# Patient Record
Sex: Male | Born: 1980 | Race: Black or African American | Hispanic: No | Marital: Married | Smoking: Never smoker
Health system: Southern US, Community
[De-identification: ages and names within clinical notes are randomized; demographics above are authoritative.]

---
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
## 2005-06-02 ENCOUNTER — Emergency Department (HOSPITAL_COMMUNITY): Admission: EM | Admit: 2005-06-02 | Discharge: 2005-06-03 | Payer: Self-pay | Admitting: Emergency Medicine

## 2021-05-17 ENCOUNTER — Encounter (HOSPITAL_COMMUNITY): Payer: Self-pay

## 2021-05-17 ENCOUNTER — Other Ambulatory Visit: Payer: Self-pay

## 2021-05-17 ENCOUNTER — Ambulatory Visit (HOSPITAL_COMMUNITY)
Admission: EM | Admit: 2021-05-17 | Discharge: 2021-05-17 | Disposition: A | Discharge status: Home / Self Care | Payer: Medicaid Other | Attending: Emergency Medicine | Admitting: Emergency Medicine

## 2021-05-17 DIAGNOSIS — R1084 Generalized abdominal pain: Secondary | ICD-10-CM

## 2021-05-17 DIAGNOSIS — E860 Dehydration: Secondary | ICD-10-CM | POA: Diagnosis not present

## 2021-05-17 DIAGNOSIS — R000 Tachycardia, unspecified: Secondary | ICD-10-CM | POA: Diagnosis not present

## 2021-05-17 LAB — POCT URINALYSIS DIPSTICK, ED / UC
Bilirubin Urine: NEGATIVE
Glucose, UA: NEGATIVE mg/dL
Hgb urine dipstick: NEGATIVE
Ketones, ur: 40 mg/dL — AB
Leukocytes,Ua: NEGATIVE
Nitrite: NEGATIVE
Protein, ur: NEGATIVE mg/dL
Specific Gravity, Urine: <=1.005 (ref 1.005–1.030)
Urobilinogen, UA: 0.2 mg/dL (ref 0.0–1.0)
pH: 6 (ref 5.0–8.0)

## 2021-05-17 NOTE — ED Triage Notes (Addendum)
Pt c/o pinching in sides when standing up.  Pt adds that he recently was prescribed antibiotics for a dental issue but never picked them up. Reports dental pain is improving.

## 2021-05-17 NOTE — ED Notes (Signed)
Pt notified of need for urine sample, unable to provide at this time. Water provided.

## 2021-05-17 NOTE — Discharge Instructions (Addendum)
Push fluids.Follow up with Er if pain persists. UA was negative for infection.

## 2021-05-17 NOTE — ED Provider Notes (Signed)
[facility]    CSN: [id number] Arrival date & time: 05/17/21  1853      History   Chief Complaint Chief Complaint  Patient presents with   side pain     HPI Edwin Nicholson is a 40 y.o. male.   40 year old special needs male presents to urgent care chief complaint of bilateral side pinching when he stands.  Patient has history of UTIs, denies nausea vomiting diarrhea, denies injury or trauma.  Last BM was today(normal).  Secondarily patient had antibiotic for dental infection but never picked up reports that is improving.  The history is provided by the patient and a relative. No language interpreter was used.   History reviewed. No pertinent past medical history.  Patient Active Problem List   Diagnosis Date Noted   Generalized abdominal pain 05/17/2021     History reviewed. No pertinent surgical history.     Home Medications    Prior to Admission medications   Not on File    Family History History reviewed. No pertinent family history.  Social History Social History   Tobacco Use   Smoking status: Never   Smokeless tobacco: Never  Substance Use Topics   Alcohol use: Not Currently   Drug use: Not Currently     Allergies   Patient has no known allergies.   Review of Systems Review of Systems  Constitutional:  Negative for activity change, appetite change and fever.  Gastrointestinal:  Positive for abdominal pain. Negative for constipation, nausea and vomiting.  All other systems reviewed and are negative.   Physical Exam Triage Vital Signs ED Triage Vitals  Enc Vitals Group     BP 05/17/21 2008 111/76     Pulse Rate 05/17/21 2008 (!) 101     Resp 05/17/21 2008 18     Temp 05/17/21 2008 99.3 F (37.4 C)     Temp Source 05/17/21 2008 Oral     SpO2 05/17/21 2008 97 %     Weight --      Height --      Head Circumference --      Peak Flow --      Pain Score 05/17/21 2009 0     Pain Loc --      Pain Edu? --      Excl. in GC?  --    No data found.  Updated Vital Signs BP 111/76   Pulse (!) 101   Temp 99.3 F (37.4 C) (Oral)   Resp 18   SpO2 97%   Visual Acuity Right Eye Distance:   Left Eye Distance:   Bilateral Distance:    Right Eye Near:   Left Eye Near:    Bilateral Near:     Physical Exam Vitals and nursing note reviewed.  Constitutional:      Appearance: Normal appearance. He is well-groomed and normal weight.     Comments: Pt stutters and is "special needs" per sister  HENT:     Head: Normocephalic.  Cardiovascular:     Rate and Rhythm: Tachycardia present.     Heart sounds: Normal heart sounds.  Pulmonary:     Effort: Pulmonary effort is normal.     Breath sounds: Normal breath sounds and air entry.  Abdominal:     General: Abdomen is flat. Bowel sounds are normal.     Tenderness: There is generalized abdominal tenderness.  Neurological:     Mental Status: He is alert.  Psychiatric:  Attention and Perception: Attention normal.        Behavior: Behavior is cooperative.     UC Treatments / Results  Labs (all labs ordered are listed, but only abnormal results are displayed) Labs Reviewed  POCT URINALYSIS DIPSTICK, ED / UC - Abnormal; Notable for the following components:      Result Value   Ketones, ur 40 (*)    All other components within normal limits    EKG   Radiology No results found.  Procedures Procedures (including critical care time)  Medications Ordered in UC Medications - No data to display  Initial Impression / Assessment and Plan / UC Course  I have reviewed the triage vital signs and the nursing notes.  Pertinent labs & imaging results that were available during my care of the patient were reviewed by me and considered in my medical decision making (see chart for details).    Abdominal pain, viral illness, UTI Final Clinical Impressions(s) / UC Diagnoses   Final diagnoses:  Mild dehydration  Generalized abdominal pain     Discharge  Instructions      Push fluids.Follow up with Er if pain persists. UA was negative for infection.      ED Prescriptions   None    PDMP not reviewed this encounter.   Clancy Gourd, NP 05/17/21 2110

## 2022-01-03 ENCOUNTER — Encounter (HOSPITAL_COMMUNITY): Payer: Self-pay

## 2022-01-03 ENCOUNTER — Ambulatory Visit (HOSPITAL_COMMUNITY)
Admission: EM | Admit: 2022-01-03 | Discharge: 2022-01-03 | Disposition: A | Discharge status: Home / Self Care | Payer: Medicaid Other | Attending: Family Medicine | Admitting: Family Medicine

## 2022-01-03 DIAGNOSIS — K219 Gastro-esophageal reflux disease without esophagitis: Secondary | ICD-10-CM

## 2022-01-03 MED ORDER — SUCRALFATE 1 G PO TABS: 1.0000 g | ORAL_TABLET | Freq: Three times a day (TID) | ORAL | 0 refills | Status: DC

## 2022-01-03 NOTE — ED Triage Notes (Signed)
Pt is here for chest pain x 8 month. She

## 2022-01-03 NOTE — Discharge Instructions (Signed)
You will need to follow up with your gastroenterologist since you're still having reflux symptoms while taking the acid-reducing medicines.

## 2022-01-03 NOTE — ED Triage Notes (Signed)
Pt has been treated for reflux in the past and reported the medication that he  was prescribed has not help his symptoms.

## 2022-01-08 NOTE — ED Provider Notes (Signed)
°  [facility]   [id number] 01/03/22 Arrival Time: 1636  ASSESSMENT & PLAN:  1. Gastroesophageal reflux disease without esophagitis    Benign abdominal exam. No indications for urgent abdominal/pelvic imaging at this time. Discussed. I feel the chest pain he is describing is either related to GERD or from his chest wall. No symptom of cardiac or resp etiology.   Continue PPI BID. Begin: Meds ordered this encounter  Medications   sucralfate (CARAFATE) 1 g tablet    Sig: Take 1 tablet (1 g total) by mouth 4 (four) times daily -  with meals and at bedtime.    Dispense:  28 tablet    Refill:  0     Discharge Instructions      You will need to follow up with your gastroenterologist since you're still having reflux symptoms while taking the acid-reducing medicines.    ECG declined. Reviewed expectations re: course of current medical issues. Questions answered. Outlined signs and symptoms indicating need for more acute intervention. Patient verbalized understanding. After Visit Summary given.   SUBJECTIVE: History from: patient. Edwin Nicholson is a 41 y.o. male who presents with complaint of epigastric and chest discomfort associated with acid reflux; long-standing. On PPI BID with occas breakthrough pain. Doesn't feel the medicine is helping as it should. Has seen GI. Normal PO intake without n/v/d. No back pain. No SOB. Chest pain is associated with increased belching; dull and anterior.  History reviewed. No pertinent surgical history.  Social History   Tobacco Use  Smoking Status Never  Smokeless Tobacco Never     OBJECTIVE:  BP 138/82 P 78 R 14 General appearance: alert, oriented, no acute distress HEENT: [place]; AT; oropharynx moist Lungs: unlabored respirations Abdomen: soft; without distention; no specific tenderness to palpation; normal bowel sounds; without masses or organomegaly; without guarding or rebound tenderness Back: without reported CVA  tenderness; FROM at waist Extremities: without LE edema; symmetrical; without gross deformities Skin: warm and dry Neurologic: normal gait Psychological: alert and cooperative; normal mood and affect   No Known Allergies                                             History reviewed. No pertinent past medical history.  Social History   Socioeconomic History   Marital status: Married    Spouse name: Not on file   Number of children: Not on file   Years of education: Not on file   Highest education level: Not on file  Occupational History   Not on file  Tobacco Use   Smoking status: Never   Smokeless tobacco: Never  Substance and Sexual Activity   Alcohol use: Not Currently   Drug use: Not Currently   Sexual activity: Not on file  Other Topics Concern   Not on file  Social History Narrative   Not on file   Social Determinants of Health   Financial Resource Strain: Not on file  Food Insecurity: Not on file  Transportation Needs: Not on file  Physical Activity: Not on file  Stress: Not on file  Social Connections: Not on file  Intimate Partner Violence: Not on file    History reviewed. No pertinent family history.   Mardella Layman, MD 01/08/22 [phone]

## 2022-05-08 ENCOUNTER — Emergency Department (HOSPITAL_COMMUNITY): Payer: Medicaid Other

## 2022-05-08 ENCOUNTER — Emergency Department (HOSPITAL_COMMUNITY)
Admission: EM | Admit: 2022-05-08 | Discharge: 2022-05-08 | Disposition: A | Discharge status: Home / Self Care | Payer: Medicaid Other | Attending: Emergency Medicine | Admitting: Emergency Medicine

## 2022-05-08 ENCOUNTER — Other Ambulatory Visit: Payer: Self-pay

## 2022-05-08 ENCOUNTER — Encounter (HOSPITAL_COMMUNITY): Payer: Self-pay

## 2022-05-08 DIAGNOSIS — R1010 Upper abdominal pain, unspecified: Secondary | ICD-10-CM

## 2022-05-08 DIAGNOSIS — R079 Chest pain, unspecified: Secondary | ICD-10-CM | POA: Diagnosis not present

## 2022-05-08 DIAGNOSIS — R1011 Right upper quadrant pain: Secondary | ICD-10-CM | POA: Insufficient documentation

## 2022-05-08 LAB — CBC
HCT: 39.7 % (ref 39.0–52.0)
Hemoglobin: 13 g/dL (ref 13.0–17.0)
MCH: 29.5 pg (ref 26.0–34.0)
MCHC: 32.7 g/dL (ref 30.0–36.0)
MCV: 90 fL (ref 80.0–100.0)
Platelets: 184 10*3/uL (ref 150–400)
RBC: 4.41 MIL/uL (ref 4.22–5.81)
RDW: 11.9 % (ref 11.5–15.5)
WBC: 5.4 10*3/uL (ref 4.0–10.5)
nRBC: 0 % (ref 0.0–0.2)

## 2022-05-08 LAB — URINALYSIS, ROUTINE W REFLEX MICROSCOPIC
Bilirubin Urine: NEGATIVE
Glucose, UA: NEGATIVE mg/dL
Hgb urine dipstick: NEGATIVE
Ketones, ur: 20 mg/dL — AB
Leukocytes,Ua: NEGATIVE
Nitrite: NEGATIVE
Protein, ur: NEGATIVE mg/dL
Specific Gravity, Urine: 1.013 (ref 1.005–1.030)
pH: 6 (ref 5.0–8.0)

## 2022-05-08 LAB — COMPREHENSIVE METABOLIC PANEL
ALT: 18 U/L (ref 0–44)
AST: 20 U/L (ref 15–41)
Albumin: 4.3 g/dL (ref 3.5–5.0)
Alkaline Phosphatase: 53 U/L (ref 38–126)
Anion gap: 7 (ref 5–15)
BUN: 27 mg/dL — ABNORMAL HIGH (ref 6–20)
CO2: 28 mmol/L (ref 22–32)
Calcium: 9.5 mg/dL (ref 8.9–10.3)
Chloride: 105 mmol/L (ref 98–111)
Creatinine, Ser: 0.96 mg/dL (ref 0.61–1.24)
GFR, Estimated: >60 mL/min (ref >60)
Glucose, Bld: 89 mg/dL (ref 70–99)
Potassium: 3.6 mmol/L (ref 3.5–5.1)
Sodium: 140 mmol/L (ref 135–145)
Total Bilirubin: 0.7 mg/dL (ref 0.3–1.2)
Total Protein: 7.4 g/dL (ref 6.5–8.1)

## 2022-05-08 LAB — TROPONIN I (HIGH SENSITIVITY)
Troponin I (High Sensitivity): <2 ng/L (ref <18)
Troponin I (High Sensitivity): <2 ng/L (ref <18)

## 2022-05-08 MED ORDER — PANTOPRAZOLE SODIUM 20 MG PO TBEC: 20.0000 mg | DELAYED_RELEASE_TABLET | Freq: Every day | ORAL | 0 refills | Status: AC

## 2022-05-08 MED ORDER — FAMOTIDINE 20 MG PO TABS: 20.0000 mg | ORAL_TABLET | Freq: Two times a day (BID) | ORAL | 0 refills | Status: DC

## 2022-05-08 MED ORDER — SUCRALFATE 1 G PO TABS: 1.0000 g | ORAL_TABLET | Freq: Three times a day (TID) | ORAL | 1 refills | Status: AC

## 2022-05-08 NOTE — ED Provider Notes (Signed)
[facility]ER[facility]ENCY [facility] Provider Note   CSN: [id number] Arrival date & time: 05/08/22  1645     History  Chief Complaint  Patient presents with   Chest Pain   Abdominal Pain    Edwin Nicholson is a 41 y.o. male.  Patient presents to the emergency department today for evaluation of intermittent upper abdominal pain/lower chest pain.  He has a reported history of GERD.  No previous surgical history.  Additional information obtained from the patient's sister who is in the emergency department with another patient.  Family member states that patient was "doubled over" in pain this morning.  Overall symptoms short-lived, typically lasting a few minutes.  No associated vomiting or diarrhea.  No shortness of breath or cough.  He has had the symptoms for "months".  Currently symptoms have resolved.  Family also concerned about frequent urination, going once every hour.  No dysuria or hematuria reported.       Home Medications Prior to Admission medications   Medication Sig Start Date End Date Taking? Authorizing Provider  famotidine (PEPCID) 20 MG tablet Take 1 tablet (20 mg total) by mouth 2 (two) times daily. 05/08/22  Yes Renne Crigler, PA-C  pantoprazole (PROTONIX) 20 MG tablet Take 1 tablet (20 mg total) by mouth daily. 05/08/22  Yes Renne Crigler, PA-C  sucralfate (CARAFATE) 1 g tablet Take 1 tablet (1 g total) by mouth 4 (four) times daily -  with meals and at bedtime. 05/08/22  Yes Renne Crigler, PA-C      Allergies    Patient has no known allergies.    Review of Systems   Review of Systems  Physical Exam Updated Vital Signs BP 101/64 (BP Location: Right Arm)   Pulse 69   Temp 98.8 F (37.1 C) (Oral)   Resp 18   Ht 6\' 4"  (1.93 m)   Wt 62.1 kg   SpO2 99%   BMI 16.68 kg/m   Physical Exam Vitals and nursing note reviewed.  Constitutional:      General: He is not in acute distress.    Appearance: He is well-developed.  HENT:     Head:  Normocephalic and atraumatic.  Eyes:     General:        Right eye: No discharge.        Left eye: No discharge.     Conjunctiva/sclera: Conjunctivae normal.  Cardiovascular:     Rate and Rhythm: Normal rate and regular rhythm.     Heart sounds: Normal heart sounds.  Pulmonary:     Effort: Pulmonary effort is normal.     Breath sounds: Normal breath sounds.  Abdominal:     Palpations: Abdomen is soft.     Tenderness: There is abdominal tenderness. There is no guarding or rebound.     Comments: Mild right upper quadrant tenderness to palpation.  Musculoskeletal:     Cervical back: Normal range of motion and neck supple.  Skin:    General: Skin is warm and dry.  Neurological:     Mental Status: He is alert.     ED Results / Procedures / Treatments   Labs (all labs ordered are listed, but only abnormal results are displayed) Labs Reviewed  COMPREHENSIVE METABOLIC PANEL - Abnormal; Notable for the following components:      Result Value   BUN 27 (*)    All other components within normal limits  CBC  TROPONIN I (HIGH SENSITIVITY)  TROPONIN I (HIGH SENSITIVITY)  EKG EKG Interpretation  Date/Time:  Wednesday May 08 2022 16:56:43 EDT Ventricular Rate:  101 PR Interval:  106 QRS Duration: 91 QT Interval:  325 QTC Calculation: 422 R Axis:   47 Text Interpretation: Sinus tachycardia Right atrial enlargement RSR' in V1 or V2, probably normal variant No old tracing to compare Confirmed by Pricilla Loveless [phone]) on 05/08/2022 8:32:48 PM  Radiology US Abdomen Limited RUQ (LIVER/GB)  Result Date: 05/08/2022 CLINICAL DATA:  Right upper quadrant abdominal pain. EXAM: ULTRASOUND ABDOMEN LIMITED RIGHT UPPER QUADRANT COMPARISON:  None Available. FINDINGS: Gallbladder: No gallstones or wall thickening visualized. No sonographic Murphy sign noted by sonographer. Common bile duct: Diameter: 2 mm Liver: No focal lesion identified. Within normal limits in parenchymal echogenicity.  Portal vein is patent on color Doppler imaging with normal direction of blood flow towards the liver. Other: None. IMPRESSION: Unremarkable right upper quadrant ultrasound. Electronically Signed   By: Elgie Collard M.D.   On: 05/08/2022 20:41   DG Chest 2 View  Result Date: 05/08/2022 CLINICAL DATA:  Chest pain EXAM: CHEST - 2 VIEW COMPARISON:  None Available. FINDINGS: The heart size and mediastinal contours are within normal limits. Both lungs are clear. The visualized skeletal structures are unremarkable. IMPRESSION: No active cardiopulmonary disease. Electronically Signed   By: Marlan Palau M.D.   On: 05/08/2022 17:49    Procedures Procedures    Medications Ordered in ED Medications - No data to display  ED Course/ Medical Decision Making/ A&P    Patient seen and examined. History obtained directly from patient as well as the patient's family member in the emergency department.  Work-up including labs, imaging, EKG ordered in triage, if performed, were reviewed.    Labs/EKG: Independently reviewed and interpreted.  This included: CBC unremarkable; CMP unremarkable; Lipase normal; troponin <2 >> <2.   Imaging: Independently visualized and interpreted.  This included: Chest x-ray, agree negative.  Medications/Fluids: None ordered  Most recent vital signs reviewed and are as follows: BP 108/80 (BP Location: Right Arm)   Pulse 80   Temp (!) 97.4 F (36.3 C) (Oral)   Resp 18   Ht 6\' 4"  (1.93 m)   Wt 62.1 kg   SpO2 100%   BMI 16.68 kg/m   Initial impression: Epigastric abdominal pain, reassuring labs.  Symptoms currently resolved.  Will obtain right upper quadrant ultrasound to evaluate for possibility of gallstones.  If negative, plan to maximize PPI, H2 blocker, Carafate.  9:27 PM Reassessment performed. Patient appears stable.  Labs personally reviewed and interpreted including: UA pending  Imaging personally visualized and interpreted including: Upper quadrant  ultrasound, agree negative.  Reviewed pertinent lab work and imaging with patient at bedside. Questions answered.   Most current vital signs reviewed and are as follows: BP 108/80 (BP Location: Right Arm)   Pulse 80   Temp (!) 97.4 F (36.3 C) (Oral)   Resp 18   Ht 6\' 4"  (1.93 m)   Wt 62.1 kg   SpO2 100%   BMI 16.68 kg/m   Plan: Discharge to home after UA.  Prescriptions written for: Protonix, Pepcid, Carafate  Other home care instructions discussed: Avoidance of food or drink that makes his symptoms worse.  ED return instructions discussed: The patient was urged to return to the Emergency Department immediately with worsening of current symptoms, worsening abdominal pain, persistent vomiting, blood noted in stools, fever, or any other concerns. The patient verbalized understanding.   Follow-up instructions discussed: Patient encouraged to follow-up with their PCP  in 7 days.                             Medical Decision Making Amount and/or Complexity of Data Reviewed Labs: ordered. Radiology: ordered.  Risk Prescription drug management.   For this patient's complaint of abdominal pain, the following conditions were considered on the differential diagnosis: gastritis/PUD, enteritis/duodenitis, appendicitis, cholelithiasis/cholecystitis, cholangitis, pancreatitis, ruptured viscus, colitis, diverticulitis, small/large bowel obstruction, proctitis, cystitis, pyelonephritis, ureteral colic, aortic dissection, aortic aneurysm. Atypical chest etiologies were also considered including ACS, PE, and pneumonia.   The patient's vital signs, pertinent lab work and imaging were reviewed and interpreted as discussed in the ED course. Hospitalization was considered for further testing, treatments, or serial exams/observation. However as patient is well-appearing, has a stable exam, and reassuring studies today, I do not feel that they warrant admission at this time. This plan was discussed  with the patient who verbalizes agreement and comfort with this plan and seems reliable and able to return to the Emergency Department with worsening or changing symptoms.          Final Clinical Impression(s) / ED Diagnoses Final diagnoses:  Upper abdominal pain    Rx / DC Orders ED Discharge Orders          Ordered    pantoprazole (PROTONIX) 20 MG tablet  Daily        05/08/22 2049    famotidine (PEPCID) 20 MG tablet  2 times daily        05/08/22 2049    sucralfate (CARAFATE) 1 g tablet  3 times daily with meals & bedtime        05/08/22 2049              Renne Crigler, PA-C 05/08/22 2130    Pricilla Loveless, MD 05/11/22 [phone]

## 2022-05-08 NOTE — ED Notes (Signed)
Pt caregiver expressed that they will wait on urine/UA results and check mychart and follow up with PCP.

## 2022-05-08 NOTE — ED Triage Notes (Signed)
Pt reports chest pain and heartburn that began yesterday. Denies SHOB, back pain, and N/V.

## 2022-05-08 NOTE — ED Notes (Addendum)
I provided reinforced discharge education based off of discharge instructions. Pt and caregiver acknowledged and understood my education. Pt and caregiver had no further questions/concerns for provider/myself.

## 2022-05-08 NOTE — ED Notes (Signed)
Advised pt need for urine sample.

## 2022-05-08 NOTE — ED Provider Triage Note (Signed)
Emergency Medicine Provider Triage Evaluation Note  Edwin Nicholson , a 41 y.o. male  was evaluated in triage.  Pt complains of "chest pain".  When asked about this, he stated happened yesterday and points to his mid-abdomen.  Happened about a year 2022 as well.  Hx of GERD, takes 40 mg PPI daily.  States the chest pain lasted "a few minutes" and then was gone.  Denies N/V/D, recent fevers, recent illness.  Denies shortness of breath or active chest pain  Review of Systems  Positive:  Negative: See above  Physical Exam  BP 114/64   Pulse 97   Temp 98.8 F (37.1 C) (Oral)   Resp (!) 25   Ht 6\' 4"  (1.93 m)   SpO2 98%  Gen:   Awake, no distress   Resp:  Normal effort  MSK:   Moves extremities without difficulty  Other:  Repetitive stuttering, appropriate responses to some questions.  Alert and oriented x4.  Abdomen soft, non-TTP.  RRR without M/R/G.  Respirations 18 on exam.  Medical Decision Making  Medically screening exam initiated at 5:06 PM.  Appropriate orders placed.  Edwin Nicholson was informed that the remainder of the evaluation will be completed by another provider, this initial triage assessment does not replace that evaluation, and the importance of remaining in the ED until their evaluation is complete.     Riley Churches, PA-C 05/08/22 1709

## 2022-05-08 NOTE — Discharge Instructions (Addendum)
Please read and follow all provided instructions.  Your diagnoses today include:  1. Upper abdominal pain     Tests performed today include: Blood cell counts and platelets Kidney and liver function tests Pancreas function test (called lipase) Ultrasound of your gallbladder: was normal Vital signs. See below for your results today.   Medications prescribed:  Protonix (pantoprazole) - medication for stomach acid  Pepcid (famotidine) - antihistamine  You can find this medication over-the-counter.   DO NOT exceed:  20mg  Pepcid every 12 hours  Carafate - for stomach upset and to protect your stomach  Take any prescribed medications only as directed.  Home care instructions:  Follow any educational materials contained in this packet.  Follow-up instructions: Please follow-up with your primary care provider in the next 3 days for further evaluation of your symptoms.    Return instructions:  SEEK IMMEDIATE MEDICAL ATTENTION IF: The pain does not go away or becomes severe  A temperature above 101F develops  Repeated vomiting occurs (multiple episodes)  The pain becomes localized to portions of the abdomen. The right side could possibly be appendicitis. In an adult, the left lower portion of the abdomen could be colitis or diverticulitis.  Blood is being passed in stools or vomit (bright red or black tarry stools)  You develop chest pain, difficulty breathing, dizziness or fainting, or become confused, poorly responsive, or inconsolable (young children) If you have any other emergent concerns regarding your health  Additional Information: Abdominal (belly) pain can be caused by many things. Your caregiver performed an examination and possibly ordered blood/urine tests and imaging (CT scan, x-rays, ultrasound). Many cases can be observed and treated at home after initial evaluation in the emergency department. Even though you are being discharged home, abdominal pain can be  unpredictable. Therefore, you need a repeated exam if your pain does not resolve, returns, or worsens. Most patients with abdominal pain don't have to be admitted to the hospital or have surgery, but serious problems like appendicitis and gallbladder attacks can start out as nonspecific pain. Many abdominal conditions cannot be diagnosed in one visit, so follow-up evaluations are very important.  Your vital signs today were: BP 101/64 (BP Location: Right Arm)   Pulse 69   Temp 98.8 F (37.1 C) (Oral)   Resp 18   Ht 6\' 4"  (1.93 m)   Wt 62.1 kg   SpO2 99%   BMI 16.68 kg/m  If your blood pressure (bp) was elevated above 135/85 this visit, please have this repeated by your doctor within one month. --------------

## 2022-06-26 ENCOUNTER — Other Ambulatory Visit: Payer: Self-pay | Admitting: Urology

## 2022-06-26 DIAGNOSIS — N50811 Right testicular pain: Secondary | ICD-10-CM

## 2022-06-28 ENCOUNTER — Other Ambulatory Visit: Payer: Medicaid Other

## 2022-07-17 ENCOUNTER — Ambulatory Visit
Admission: RE | Admit: 2022-07-17 | Discharge: 2022-07-17 | Disposition: A | Discharge status: Home / Self Care | Payer: Medicaid Other | Source: Ambulatory Visit | Attending: Urology | Admitting: Urology

## 2022-07-17 DIAGNOSIS — N50811 Right testicular pain: Secondary | ICD-10-CM

## 2022-09-30 ENCOUNTER — Ambulatory Visit: Payer: Medicaid Other | Admitting: Physical Therapy

## 2022-11-06 ENCOUNTER — Other Ambulatory Visit: Payer: Self-pay

## 2022-11-06 ENCOUNTER — Emergency Department (HOSPITAL_COMMUNITY): Payer: Medicaid Other

## 2022-11-06 ENCOUNTER — Emergency Department (HOSPITAL_COMMUNITY)
Admission: EM | Admit: 2022-11-06 | Discharge: 2022-11-06 | Disposition: A | Discharge status: Home / Self Care | Payer: Medicaid Other | Attending: Emergency Medicine | Admitting: Emergency Medicine

## 2022-11-06 DIAGNOSIS — F840 Autistic disorder: Secondary | ICD-10-CM | POA: Insufficient documentation

## 2022-11-06 DIAGNOSIS — R1084 Generalized abdominal pain: Secondary | ICD-10-CM | POA: Diagnosis present

## 2022-11-06 DIAGNOSIS — R1013 Epigastric pain: Secondary | ICD-10-CM | POA: Diagnosis not present

## 2022-11-06 LAB — CBC
HCT: 38.8 % — ABNORMAL LOW (ref 39.0–52.0)
Hemoglobin: 12.3 g/dL — ABNORMAL LOW (ref 13.0–17.0)
MCH: 28.8 pg (ref 26.0–34.0)
MCHC: 31.7 g/dL (ref 30.0–36.0)
MCV: 90.9 fL (ref 80.0–100.0)
Platelets: 146 10*3/uL — ABNORMAL LOW (ref 150–400)
RBC: 4.27 MIL/uL (ref 4.22–5.81)
RDW: 11.8 % (ref 11.5–15.5)
WBC: 4.2 10*3/uL (ref 4.0–10.5)
nRBC: 0 % (ref 0.0–0.2)

## 2022-11-06 LAB — COMPREHENSIVE METABOLIC PANEL
ALT: 20 U/L (ref 0–44)
AST: 22 U/L (ref 15–41)
Albumin: 3.9 g/dL (ref 3.5–5.0)
Alkaline Phosphatase: 45 U/L (ref 38–126)
Anion gap: 6 (ref 5–15)
BUN: 23 mg/dL — ABNORMAL HIGH (ref 6–20)
CO2: 25 mmol/L (ref 22–32)
Calcium: 9.3 mg/dL (ref 8.9–10.3)
Chloride: 110 mmol/L (ref 98–111)
Creatinine, Ser: 0.91 mg/dL (ref 0.61–1.24)
GFR, Estimated: >60 mL/min (ref >60)
Glucose, Bld: 84 mg/dL (ref 70–99)
Potassium: 3.9 mmol/L (ref 3.5–5.1)
Sodium: 141 mmol/L (ref 135–145)
Total Bilirubin: 0.6 mg/dL (ref 0.3–1.2)
Total Protein: 6.8 g/dL (ref 6.5–8.1)

## 2022-11-06 LAB — CBG MONITORING, ED
Glucose-Capillary: 66 mg/dL — ABNORMAL LOW (ref 70–99)
Glucose-Capillary: 88 mg/dL (ref 70–99)
Glucose-Capillary: 99 mg/dL (ref 70–99)

## 2022-11-06 LAB — LIPASE, BLOOD: Lipase: 53 U/L — ABNORMAL HIGH (ref 11–51)

## 2022-11-06 MED ORDER — FAMOTIDINE 20 MG PO TABS: 20.0000 mg | ORAL_TABLET | Freq: Two times a day (BID) | ORAL | 2 refills | Status: AC

## 2022-11-06 MED ORDER — IOHEXOL 300 MG/ML  SOLN
100.0000 mL | Freq: Once | INTRAMUSCULAR | Status: AC | PRN
  Administered 2022-11-06: 100 mL via INTRAVENOUS

## 2022-11-06 NOTE — ED Provider Notes (Signed)
[facility] Provider Note   CSN: [id number] Arrival date & time: 11/06/22  1104     History {Add pertinent medical, surgical, social history, OB history to HPI:1} Chief Complaint  Patient presents with   Abdominal Pain    Edwin Nicholson is a 41 y.o. male.  He is here with complaint of severe generalized and upper abdominal pain that has been going on today.  He has had it before.  He usually takes Pepto-Bismol for it.  Denies fevers chills nausea vomiting diarrhea.  No urinary symptoms.  No trauma.  He has a history of autism and stuttering.  Brother is here assisting with history.  The history is provided by the patient.  Abdominal Pain Pain location:  Generalized Pain severity:  Severe Onset quality:  Gradual Duration:  1 day Timing:  Constant Progression:  Unchanged Chronicity:  Recurrent Context: not trauma   Relieved by:  None tried Worsened by:  Nothing Ineffective treatments:  None tried Associated symptoms: no chest pain, no constipation, no cough, no diarrhea, no dysuria, no fever, no nausea and no vomiting        Home Medications Prior to Admission medications   Medication Sig Start Date End Date Taking? Authorizing Provider  famotidine (PEPCID) 20 MG tablet Take 1 tablet (20 mg total) by mouth 2 (two) times daily. 05/08/22   Renne Crigler, PA-C  pantoprazole (PROTONIX) 20 MG tablet Take 1 tablet (20 mg total) by mouth daily. 05/08/22   Renne Crigler, PA-C  sucralfate (CARAFATE) 1 g tablet Take 1 tablet (1 g total) by mouth 4 (four) times daily -  with meals and at bedtime. 05/08/22   Renne Crigler, PA-C      Allergies    Patient has no known allergies.    Review of Systems   Review of Systems  Constitutional:  Negative for fever.  Respiratory:  Negative for cough.   Cardiovascular:  Negative for chest pain.  Gastrointestinal:  Positive for abdominal pain. Negative for constipation, diarrhea, nausea and vomiting.   Genitourinary:  Negative for dysuria.    Physical Exam Updated Vital Signs BP 107/67   Pulse 65   Temp 98.6 F (37 C) (Oral)   Resp 16   SpO2 100%  Physical Exam Vitals and nursing note reviewed.  Constitutional:      General: He is not in acute distress.    Appearance: Normal appearance. He is well-developed and underweight.  HENT:     Head: Normocephalic and atraumatic.  Eyes:     Conjunctiva/sclera: Conjunctivae normal.  Cardiovascular:     Rate and Rhythm: Normal rate and regular rhythm.     Heart sounds: No murmur heard. Pulmonary:     Effort: Pulmonary effort is normal. No respiratory distress.     Breath sounds: Normal breath sounds.  Abdominal:     Palpations: Abdomen is soft.     Tenderness: There is no abdominal tenderness. There is no guarding or rebound.  Musculoskeletal:        General: No swelling.     Cervical back: Neck supple.  Skin:    General: Skin is warm and dry.     Capillary Refill: Capillary refill takes less than 2 seconds.  Neurological:     General: No focal deficit present.     Mental Status: He is alert.     ED Results / Procedures / Treatments   Labs (all labs ordered are listed, but only abnormal results are displayed) Labs Reviewed  LIPASE, BLOOD - Abnormal; Notable for the following components:      Result Value   Lipase 53 (*)    All other components within normal limits  COMPREHENSIVE METABOLIC PANEL - Abnormal; Notable for the following components:   BUN 23 (*)    All other components within normal limits  CBC - Abnormal; Notable for the following components:   Hemoglobin 12.3 (*)    HCT 38.8 (*)    Platelets 146 (*)    All other components within normal limits  CBG MONITORING, ED - Abnormal; Notable for the following components:   Glucose-Capillary 66 (*)    All other components within normal limits  URINALYSIS, ROUTINE W REFLEX MICROSCOPIC  CBG MONITORING, ED    EKG None  Radiology No results  found.  Procedures Procedures  {Document cardiac monitor, telemetry assessment procedure when appropriate:1}  Medications Ordered in ED Medications - No data to display  ED Course/ Medical Decision Making/ A&P                           Medical Decision Making Amount and/or Complexity of Data Reviewed Labs: ordered. Radiology: ordered.   This patient complains of ***; this involves an extensive number of treatment Options and is a complaint that carries with it a high risk of complications and morbidity. The differential includes ***  I ordered, reviewed and interpreted labs, which included *** I ordered medication *** and reviewed PMP when indicated. I ordered imaging studies which included *** and I independently    visualized and interpreted imaging which showed *** Additional history obtained from *** Previous records obtained and reviewed *** I consulted *** and discussed lab and imaging findings and discussed disposition.  Cardiac monitoring reviewed, *** Social determinants considered, *** Critical Interventions: ***  After the interventions stated above, I reevaluated the patient and found *** Admission and further testing considered, ***   {Document critical care time when appropriate:1} {Document review of labs and clinical decision tools ie heart score, Chads2Vasc2 etc:1}  {Document your independent review of radiology images, and any outside records:1} {Document your discussion with family members, caretakers, and with consultants:1} {Document social determinants of health affecting pt's care:1} {Document your decision making why or why not admission, treatments were needed:1} Final Clinical Impression(s) / ED Diagnoses Final diagnoses:  None    Rx / DC Orders ED Discharge Orders     None

## 2022-11-06 NOTE — Discharge Instructions (Addendum)
You were seen in the emergency department for abdominal pain.  You had lab work and a CAT scan that did not show an obvious explanation for your symptoms.  We are starting you on some acid medication.  Please increase your fluid and fiber intake.  Follow-up with your regular doctor.  Return to the emergency department if any worsening or concerning symptoms

## 2022-11-06 NOTE — ED Provider Triage Note (Signed)
Emergency Medicine Provider Triage Evaluation Note  Edwin Nicholson , a 41 y.o. male  was evaluated in triage.  Pt complains of upper abdominal pain.  Symptoms been present since May 2022.  States that worse overnight.  Took his Pepcid at home, did not take his Protonix.  Sees GI and was told it was GERD.  EMS reportedly said that family wants him checked.  Denies nausea, vomiting, diarrhea, fevers, chills  Review of Systems  Positive: As above Negative: As above  Physical Exam  BP 111/80 (BP Location: Left Arm)   Pulse 76   Temp 98.8 F (37.1 C) (Oral)   Resp 18   SpO2 100%  Gen:   Awake, no distress   Resp:  Normal effort  MSK:   Moves extremities without difficulty  Other:  No tenderness to palpation of the abdomen  Medical Decision Making  Medically screening exam initiated at 12:06 PM.  Appropriate orders placed.  Edwin Nicholson was informed that the remainder of the evaluation will be completed by another provider, this initial triage assessment does not replace that evaluation, and the importance of remaining in the ED until their evaluation is complete.     Michelle Piper, [place] 11/06/22 1209

## 2022-11-06 NOTE — ED Notes (Signed)
I provided reinforced discharge education based off of after visit summary/care provided. Pt acknowledged and understood my education. Pt had no further questions/concerns for provider/myself. After visit summary provided to pt. 

## 2022-11-06 NOTE — ED Triage Notes (Addendum)
BIB EMS with bil upper abd pain for 6 months, followed up by GI was told it was GERD, family wanted him evaluated for his pain, did not take prescribed meds today. 122/70-74-99%-CBG 95

## 2022-12-31 ENCOUNTER — Other Ambulatory Visit: Payer: Self-pay

## 2022-12-31 ENCOUNTER — Ambulatory Visit: Payer: Medicaid Other | Attending: Urology

## 2022-12-31 DIAGNOSIS — R3915 Urgency of urination: Secondary | ICD-10-CM | POA: Diagnosis present

## 2022-12-31 DIAGNOSIS — R293 Abnormal posture: Secondary | ICD-10-CM | POA: Diagnosis present

## 2022-12-31 DIAGNOSIS — M6281 Muscle weakness (generalized): Secondary | ICD-10-CM | POA: Insufficient documentation

## 2022-12-31 DIAGNOSIS — R279 Unspecified lack of coordination: Secondary | ICD-10-CM | POA: Diagnosis present

## 2022-12-31 DIAGNOSIS — M62838 Other muscle spasm: Secondary | ICD-10-CM | POA: Insufficient documentation

## 2022-12-31 NOTE — Patient Instructions (Signed)
Squatty potty: When your knees are level or below the level of your hips, pelvic floor muscles are pressed against rectum, preventing ease of bowel movement. By getting knees above the level of the hips, these pelvic floor muscles relax, allowing easier passage of bowel movement.  Ways to get knees above hips: o Squatty Potty (7inch and 9inch versions) o Small stool o Roll of toilet paper under each foot o Hardback book or stack of magazines under each foot  Relaxed Toileting mechanics: Once in this position, make sure to lean forward with forearms on thighs, wide knees, relaxed stomach, and breathe.    Bowel massage: To assist with more regular and more comfortable bowel movements, try performing bowel massage nightly for 5-10 minutes. Place hands in the lower right side of your abdomen to start; in small circles, massage up, across, and down the left side of your abdomen. Pressure does not need to be hard, but just comfortable. You can use lotion or oil to make more comfortable.      [facility] [address], [place], [place] [address] Phone # [phone] Fax [phone]

## 2022-12-31 NOTE — Therapy (Signed)
OUTPATIENT PHYSICAL THERAPY MALE PELVIC EVALUATION   Patient Name: Edwin Nicholson MRN: [id number] DOB:June 30, 1981, 42 y.o., male Today's Date: 12/31/2022  END OF SESSION:  PT End of Session - 12/31/22 1735     Visit Number 1    Date for PT Re-Evaluation 06/17/23    Authorization Type CCME    Authorization Time Period waiting for auth    PT Start Time 1615    PT Stop Time 1700    PT Time Calculation (min) 45 min    Activity Tolerance Patient tolerated treatment well    Behavior During Therapy [facility] for tasks assessed/performed             History reviewed. No pertinent past medical history. History reviewed. No pertinent surgical history. Patient Active Problem List   Diagnosis Date Noted   Generalized abdominal pain 05/17/2021    PCP: Koleen Distance, MD  REFERRING PROVIDER: Janith Lima, MD  REFERRING DIAG: [phone] (ICD-10-CM) - Left testicular pain R39.15 (ICD-10-CM) - Urinary urgency  THERAPY DIAG:  Abnormal posture  Muscle weakness (generalized)  Other muscle spasm  Unspecified lack of coordination  Urinary urgency  Rationale for Evaluation and Treatment: Rehabilitation  ONSET DATE: Summer 2023  SUBJECTIVE:                                                                                                                                                                                           12/31/22 SUBJECTIVE STATEMENT: Pt states that he is having trouble with urinary incontinence if he does not make it in time. Pt states that testicular pain is gone.  Fluid intake: some water - no sodas or caffeine   PAIN:  Are you having pain? No   PRECAUTIONS: None  WEIGHT BEARING RESTRICTIONS: No  FALLS:  Has patient fallen in last 6 months? No  LIVING ENVIRONMENT: Lives with: lives with their family Lives in: House/apartment  OCCUPATION: no  PLOF: Independent  PATIENT GOALS: to have less urgency and no leaking  PERTINENT HISTORY:  Abdominal  pain   BOWEL MOVEMENT: Pain with bowel movement: Yes Type of bowel movement:Frequency every other day and Strain No Fully empty rectum: Yes: - Leakage: No Pads: No Fiber supplement: No  URINATION: Pain with urination: No Fully empty bladder: Yes: - Stream:  WNL Urgency: Yes: pt has to go immediately Frequency: unsure Leakage: Urge to void and Walking to the bathroom Pads: No  INTERCOURSE: Pain with intercourse: Not sexually active Climax: yes Ejaculation: No   OBJECTIVE:  12/31/22: DIAGNOSTIC FINDINGS:  [place] of scrotum WNL  COGNITION: Overall cognitive status: Within functional limits for tasks assessed  SENSATION: Light touch: Appears intact Proprioception: Appears intact  MUSCLE LENGTH: decreased hip flexor and abdominal muscle length   FUNCTIONAL TESTS: Lt weight shift   GAIT: Comments: forward folded posture, decreased bil hip extension  POSTURE: rounded shoulders, forward head, increased thoracic kyphosis, and posterior pelvic tilt  PELVIC ALIGNMENT: posterior pelvic tilt  LUMBARAROM/PROM:  A/PROM A/PROM  Eval (% available)  Flexion 50  Extension 10  Right lateral flexion 50  Left lateral flexion 50  Right rotation 25  Left rotation 25   (Blank rows = not tested)  LOWER EXTREMITY AROM/PROM:   LOWER EXTREMITY MMT: weakness present, but unable to perform formal testing with max verbal cues this session   PALPATION: GENERAL significant abdominal restriction; decreased rib cage mobility              External Perineal Exam NA              Internal Pelvic Floor NA Patient confirms identification and approves PT to assess internal pelvic floor and treatment No pt did not consent to external or internal pelvic floor muscle assessment today, but states that he may be willing to in the future  PELVIC MMT: NA   MMT eval  Internal Anal Sphincter   External Anal Sphincter   Puborectalis   Diastasis Recti   (Blank rows = not  tested)  TONE: NA  TODAY'S TREATMENT:                                                                                                                              DATE: 12/31/22  EVAL  Exercises: Seated hip abduction ball squeeze 10x Seated hip adduction band press 10x Lower trunk rotation  Therapeutic activities: Squatty potty/relaxed toilet mechanics Bowel massage  Check all possible CPT codes: 97110- Therapeutic Exercise and 97530 - Therapeutic Activities    Check all conditions that are expected to impact treatment: Cognitive impairment   If treatment provided at initial evaluation, no treatment charged due to lack of authorization.         PATIENT EDUCATION:  Education details: see above Person educated: Patient Education method: Explanation, Demonstration, Tactile cues, Verbal cues, and Handouts Education comprehension: verbalized understanding  HOME EXERCISE PROGRAM: [id number]  ASSESSMENT:  CLINICAL IMPRESSION: Patient is a 42 y.o. male who was seen today for physical therapy evaluation and treatment for urinary urgency/frequency/incontinence. Exam findings limited due to patient request to not perform internal/external pelvic floor assessment; however, other findings include abnormal posture, decreased rib mobility, significant restriction/tightness in abdominals, hip weakness (findings limited by difficulty understanding instruction), decreased anterior hip mobility, decreased lumbar A/ROM. Signs and symptoms at this time are most likely due to restriction throughout core, generalized weakness, and poor bladder habits; constipation may also be impacting urinary urgency. There was a barrier to communicate due to cognitive status at time of evaluation that limited evaluation and treatment. Initial treatment consisted of lumbar mobility and gentle hip/core strengthening to help get  pelvic floor moving; we also discussed self-bowel massage and toilet mechanics to help improve  bowel movement. Pt very agreeable to attempt these activities at home and written instructions/pictures provided. He may continue to benefit form skilled PT intervention in order to help improve urinary frequency, urgency, and incontinence in addition to constipation.   OBJECTIVE IMPAIRMENTS: decreased activity tolerance, decreased cognition, decreased coordination, decreased endurance, decreased mobility, decreased strength, increased fascial restrictions, increased muscle spasms, postural dysfunction, and pain.   ACTIVITY LIMITATIONS: continence  PARTICIPATION LIMITATIONS: community activity  PERSONAL FACTORS: Behavior pattern and Fitness are also affecting patient's functional outcome.   REHAB POTENTIAL: Fair communication difficulties  CLINICAL DECISION MAKING: Stable/uncomplicated  EVALUATION COMPLEXITY: Low   GOALS: Goals reviewed with patient? Yes  SHORT TERM GOALS: Target date: 02/04/23  Pt will be independent with HEP.   Baseline: Goal status: INITIAL  2.  Pt will be independent with use of urge suppression technique in order to help decrease urinary urgency. Baseline: Pt goes to bathroom to urinate very frequently and has to go immediately after urge in order to prevent leak.  Goal status: INITIAL  3.  Pt will be independent with diaphragmatic breathing and down training activities in order to improve pelvic floor relaxation.  Baseline:  Goal status: INITIAL  4.  Pt will be independent with use of squatty potty, relaxed toileting mechanics, and improved bowel movement techniques in order to increase ease of bowel movements and complete evacuation.   Baseline: Pt has 2-3 bowel movements a week - constipation may be impacting urinary dysfunction Goal status: INITIAL    LONG TERM GOALS: Target date: 06/17/2023   Pt will be independent with advanced HEP.   Baseline:  Goal status: INITIAL  2.  Patient will improve all lumbar A/ROM by 25%.  Baseline: al motions  limited between 10-50% Goal status: INITIAL  3.  Pt will demonstrate improved abdominal muscle mobility and rib cage excursion to allow more appropriate breathing/abdominal pressure management. Baseline: Significant rib cage and abdominal restriction Goal status: INITIAL  4.  Pt will be able to go 2-3 hours in between voids without urgency or incontinence in order to improve QOL and perform all functional activities with less difficulty.   Baseline: Pt going to the bathroom very often (he's unsure how often) and will leak if he does not immediately go Goal status: INITIAL     PLAN:  PT FREQUENCY: 1-2x/week  PT DURATION: 6 months  PLANNED INTERVENTIONS: Therapeutic exercises, Therapeutic activity, Neuromuscular re-education, Balance training, Gait training, Patient/Family education, Self Care, Joint mobilization, Dry Needling, Biofeedback, and Manual therapy  PLAN FOR NEXT SESSION: abdominal manual techniques; core mobility/strengthening; urge suppression technique; evaluate pelvic floor externally if patient consents to over-clothes exam   Heather Roberts, PT, DPT02/13/245:36 PM

## 2023-02-04 ENCOUNTER — Ambulatory Visit: Payer: Medicaid Other | Attending: Urology

## 2023-02-04 DIAGNOSIS — R293 Abnormal posture: Secondary | ICD-10-CM | POA: Insufficient documentation

## 2023-02-04 DIAGNOSIS — M6281 Muscle weakness (generalized): Secondary | ICD-10-CM | POA: Diagnosis present

## 2023-02-04 DIAGNOSIS — M62838 Other muscle spasm: Secondary | ICD-10-CM | POA: Diagnosis present

## 2023-02-04 DIAGNOSIS — R3915 Urgency of urination: Secondary | ICD-10-CM | POA: Insufficient documentation

## 2023-02-04 DIAGNOSIS — R279 Unspecified lack of coordination: Secondary | ICD-10-CM | POA: Insufficient documentation

## 2023-02-04 NOTE — Therapy (Signed)
OUTPATIENT PHYSICAL THERAPY TREATMENT NOTE   Patient Name: Edwin Nicholson MRN: [id number] DOB:December 05, 1980, 42 y.o., male Today's Date: 02/04/2023  PCP: Koleen Distance, MD REFERRING PROVIDER: Janith Lima, MD  END OF SESSION:   PT End of Session - 02/04/23 1059     Visit Number 2    Date for PT Re-Evaluation 06/17/23    Authorization Type CCME    Authorization Time Period waiting for auth    PT Start Time 1100    PT Stop Time 1140    PT Time Calculation (min) 40 min    Activity Tolerance Patient tolerated treatment well    Behavior During Therapy [facility] for tasks assessed/performed             History reviewed. No pertinent past medical history. History reviewed. No pertinent surgical history. Patient Active Problem List   Diagnosis Date Noted   Generalized abdominal pain 05/17/2021    REFERRING DIAG: N50.812 (ICD-10-CM) - Left testicular pain R39.15 (ICD-10-CM) - Urinary urgency  THERAPY DIAG:  Abnormal posture  Muscle weakness (generalized)  Other muscle spasm  Unspecified lack of coordination  Urinary urgency  Rationale for Evaluation and Treatment Rehabilitation  PERTINENT HISTORY: abdominal pain  PRECAUTIONS: NA  SUBJECTIVE:                                                                                                                                                                                      SUBJECTIVE STATEMENT:  Pt reports no change in condition.    PAIN:  Are you having pain? No   12/31/22 SUBJECTIVE STATEMENT: Pt states that he is having trouble with urinary incontinence if he does not make it in time. Pt states that testicular pain is gone.  Fluid intake: some water - no sodas or caffeine   PAIN:  Are you having pain? No   PRECAUTIONS: None  WEIGHT BEARING RESTRICTIONS: No  FALLS:  Has patient fallen in last 6 months? No  LIVING ENVIRONMENT: Lives with: lives with their family Lives in: House/apartment  OCCUPATION:  no  PLOF: Independent  PATIENT GOALS: to have less urgency and no leaking  PERTINENT HISTORY:  Abdominal pain   BOWEL MOVEMENT: Pain with bowel movement: Yes Type of bowel movement:Frequency every other day and Strain No Fully empty rectum: Yes: - Leakage: No Pads: No Fiber supplement: No  URINATION: Pain with urination: No Fully empty bladder: Yes: - Stream: WNL Urgency: Yes: pt has to go immediately Frequency: unsure Leakage: Urge to void and Walking to the bathroom Pads: No  INTERCOURSE: Pain with intercourse: Not sexually active Climax: yes Ejaculation: No   OBJECTIVE:  12/31/22: DIAGNOSTIC FINDINGS:  [place] of scrotum WNL  COGNITION: Overall cognitive status: Within functional limits for tasks assessed     SENSATION: Light touch: Appears intact Proprioception: Appears intact  MUSCLE LENGTH: decreased hip flexor and abdominal muscle length   FUNCTIONAL TESTS: Lt weight shift   GAIT: Comments: forward folded posture, decreased bil hip extension  POSTURE: rounded shoulders, forward head, increased thoracic kyphosis, and posterior pelvic tilt  PELVIC ALIGNMENT: posterior pelvic tilt  LUMBARAROM/PROM:  A/PROM A/PROM  Eval (% available)  Flexion 50  Extension 10  Right lateral flexion 50  Left lateral flexion 50  Right rotation 25  Left rotation 25   (Blank rows = not tested)  LOWER EXTREMITY AROM/PROM:   LOWER EXTREMITY MMT: weakness present, but unable to perform formal testing with max verbal cues this session   PALPATION: GENERAL significant abdominal restriction; decreased rib cage mobility              External Perineal Exam NA              Internal Pelvic Floor NA Patient confirms identification and approves PT to assess internal pelvic floor and treatment No pt did not consent to external or internal pelvic floor muscle assessment today, but states that he may be willing to in the future  PELVIC MMT: NA   MMT eval  Internal Anal  Sphincter   External Anal Sphincter   Puborectalis   Diastasis Recti   (Blank rows = not tested)  TONE: NA  TODAY'S TREATMENT:                                                                                                                              DATE:   02/04/23: Neuromuscular re-education: Supine UE ball press 2 x 10 Exercises: Wide leg lower trunk rotation 2 x 10 Bil hip IR stretch 3 x 30 sec bil Supine piriformis stretch 2 min bil Supine hamstring stretch with strap 2 min bil Clam shell 2 x 10 bil Reverse clam shells 2 x 10 bil   12/31/22 : EVAL  Exercises: Seated hip abduction ball squeeze 10x Seated hip adduction band press 10x Lower trunk rotation  Therapeutic activities: Squatty potty/relaxed toilet mechanics Bowel massage  Check all possible CPT codes: 97110- Therapeutic Exercise and 97530 - Therapeutic Activities    Check all conditions that are expected to impact treatment: Cognitive impairment   If treatment provided at initial evaluation, no treatment charged due to lack of authorization.         PATIENT EDUCATION:  Education details: see above Person educated: Patient Education method: Explanation, Demonstration, Tactile cues, Verbal cues, and Handouts Education comprehension: verbalized understanding  HOME EXERCISE PROGRAM: [id number]  ASSESSMENT:  CLINICAL IMPRESSION: Pt reports not seeing any difference in condition or working on his HEP. Exercise progressions for core/hip strengthening and pelvic mobility to help give more control over urinary incontinence and pressure. Max cuing needed for all exercises and stretches today. Written instructions  and pictures provided for HEP. We reviewed exercises given initially as well, but still required max cuing for completion. Due to decrease in clear communication, do not believe that pelvic exam is appropriate at this time. We will see if verbal cues for pelvic floor contraction and generalized  strengthening/mobility resolve patient complaints. Good tolerance to all activities this session. Pt did fatigue quickly with clam shells. He may continue to benefit form skilled PT intervention in order to help improve urinary frequency, urgency, and incontinence in addition to constipation.   OBJECTIVE IMPAIRMENTS: decreased activity tolerance, decreased cognition, decreased coordination, decreased endurance, decreased mobility, decreased strength, increased fascial restrictions, increased muscle spasms, postural dysfunction, and pain.   ACTIVITY LIMITATIONS: continence  PARTICIPATION LIMITATIONS: community activity  PERSONAL FACTORS: Behavior pattern and Fitness are also affecting patient's functional outcome.   REHAB POTENTIAL: Fair communication difficulties  CLINICAL DECISION MAKING: Stable/uncomplicated  EVALUATION COMPLEXITY: Low   GOALS: Goals reviewed with patient? Yes  SHORT TERM GOALS: Target date: 02/04/23 - updated 02/04/23  Pt will be independent with HEP.   Baseline: Goal status: IN PROGRESS  2.  Pt will be independent with use of urge suppression technique in order to help decrease urinary urgency. Baseline: Pt goes to bathroom to urinate very frequently and has to go immediately after urge in order to prevent leak.  Goal status: IN PROGRESS  3.  Pt will be independent with diaphragmatic breathing and down training activities in order to improve pelvic floor relaxation.  Baseline:  Goal status:IN PROGRESS  4.  Pt will be independent with use of squatty potty, relaxed toileting mechanics, and improved bowel movement techniques in order to increase ease of bowel movements and complete evacuation.   Baseline: Pt has 2-3 bowel movements a week - constipation may be impacting urinary dysfunction Goal status: IN PROGRESS    LONG TERM GOALS: Target date: 06/17/2023   Pt will be independent with advanced HEP.   Baseline:  Goal status: IN PROGRESS  2.  Patient  will improve all lumbar A/ROM by 25%.  Baseline: al motions limited between 10-50% Goal status: IN PROGRESS  3.  Pt will demonstrate improved abdominal muscle mobility and rib cage excursion to allow more appropriate breathing/abdominal pressure management. Baseline: Significant rib cage and abdominal restriction Goal status: IN PROGRESS  4.  Pt will be able to go 2-3 hours in between voids without urgency or incontinence in order to improve QOL and perform all functional activities with less difficulty.   Baseline: Pt going to the bathroom very often (he's unsure how often) and will leak if he does not immediately go Goal status: IN PROGRESS     PLAN:  PT FREQUENCY: 1-2x/week  PT DURATION: 6 months  PLANNED INTERVENTIONS: Therapeutic exercises, Therapeutic activity, Neuromuscular re-education, Balance training, Gait training, Patient/Family education, Self Care, Joint mobilization, Dry Needling, Biofeedback, and Manual therapy  PLAN FOR NEXT SESSION: abdominal manual techniques; core mobility/strengthening; urge suppression technique  Heather Roberts, PT, DPT03/19/2411:30 AM

## 2023-02-12 ENCOUNTER — Ambulatory Visit: Payer: Medicaid Other

## 2023-02-12 ENCOUNTER — Ambulatory Visit: Payer: Medicaid Other | Admitting: Physical Therapy

## 2023-02-12 DIAGNOSIS — R279 Unspecified lack of coordination: Secondary | ICD-10-CM

## 2023-02-12 DIAGNOSIS — R293 Abnormal posture: Secondary | ICD-10-CM

## 2023-02-12 DIAGNOSIS — M62838 Other muscle spasm: Secondary | ICD-10-CM

## 2023-02-12 DIAGNOSIS — M6281 Muscle weakness (generalized): Secondary | ICD-10-CM

## 2023-02-12 DIAGNOSIS — R3915 Urgency of urination: Secondary | ICD-10-CM

## 2023-02-12 NOTE — Therapy (Signed)
OUTPATIENT PHYSICAL THERAPY TREATMENT NOTE   Patient Name: TAVAUGHN LISIECKI MRN: [id number] DOB:[birth year], 42 y.o., male Today's Date: 02/12/2023  PCP: Koleen Distance, MD REFERRING PROVIDER: Janith Lima, MD  END OF SESSION:   PT End of Session - 02/12/23 1134     Visit Number 3    Date for PT Re-Evaluation 06/17/23    Authorization Type CCME 3 visits expiring 02/24/23    Authorization - Visit Number 2    Authorization - Number of Visits 3    PT Start Time [id number]    PT Stop Time 1143    PT Time Calculation (min) 40 min    Activity Tolerance Patient tolerated treatment well    Behavior During Therapy [facility] for tasks assessed/performed              No past medical history on file. No past surgical history on file. Patient Active Problem List   Diagnosis Date Noted   Generalized abdominal pain 05/17/2021    REFERRING DIAG: N50.812 (ICD-10-CM) - Left testicular pain R39.15 (ICD-10-CM) - Urinary urgency  THERAPY DIAG:  Abnormal posture  Muscle weakness (generalized)  Other muscle spasm  Unspecified lack of coordination  Urinary urgency  Rationale for Evaluation and Treatment Rehabilitation  PERTINENT HISTORY: abdominal pain  PRECAUTIONS: NA  SUBJECTIVE:                                                                                                                                                                                      SUBJECTIVE STATEMENT:  Pt states that he is having pain in lower abdomen and "the area where urine comes out". This has been for the last four days.  I notice more pain when blood wasn't low.  No pain currently   PAIN:  Are you having pain? No   12/31/22 SUBJECTIVE STATEMENT: Pt states that he is having pain in lower abdomen and "the area where urine comes out". This has been for the last four days.  I notice more pain when blood wasn't low.  No pain currently Fluid intake: some water - no sodas or caffeine   PAIN:  Are you  having pain? No   PRECAUTIONS: None  WEIGHT BEARING RESTRICTIONS: No  FALLS:  Has patient fallen in last 6 months? No  LIVING ENVIRONMENT: Lives with: lives with their family Lives in: House/apartment  OCCUPATION: no  PLOF: Independent  PATIENT GOALS: to have less urgency and no leaking  PERTINENT HISTORY:  Abdominal pain   BOWEL MOVEMENT: Pain with bowel movement: Yes Type of bowel movement:Frequency every other day and Strain No Fully empty rectum: Yes: - Leakage: No Pads: No Fiber  supplement: No  URINATION: Pain with urination: No Fully empty bladder: Yes: - Stream: WNL Urgency: Yes: pt has to go immediately Frequency: unsure Leakage: Urge to void and Walking to the bathroom Pads: No  INTERCOURSE: Pain with intercourse: Not sexually active Climax: yes Ejaculation: No   OBJECTIVE:  12/31/22: DIAGNOSTIC FINDINGS:  [place] of scrotum WNL  COGNITION: Overall cognitive status: Within functional limits for tasks assessed     SENSATION: Light touch: Appears intact Proprioception: Appears intact  MUSCLE LENGTH: decreased hip flexor and abdominal muscle length   FUNCTIONAL TESTS: Lt weight shift   GAIT: Comments: forward folded posture, decreased bil hip extension  POSTURE: rounded shoulders, forward head, increased thoracic kyphosis, and posterior pelvic tilt  PELVIC ALIGNMENT: posterior pelvic tilt  LUMBARAROM/PROM:  A/PROM A/PROM  Eval (% available)  Flexion 50  Extension 10  Right lateral flexion 50  Left lateral flexion 50  Right rotation 25  Left rotation 25   (Blank rows = not tested)  LOWER EXTREMITY AROM/PROM:   LOWER EXTREMITY MMT: weakness present, but unable to perform formal testing with max verbal cues this session   PALPATION: GENERAL significant abdominal restriction; decreased rib cage mobility              External Perineal Exam NA              Internal Pelvic Floor NA Patient confirms identification and approves PT  to assess internal pelvic floor and treatment No pt did not consent to external or internal pelvic floor muscle assessment today, but states that he may be willing to in the future  PELVIC MMT: NA   MMT eval  Internal Anal Sphincter   External Anal Sphincter   Puborectalis   Diastasis Recti   (Blank rows = not tested)  TONE: NA  TODAY'S TREATMENT:                                                                                                                              DATE:   02/12/23: Neuromuscular re-education: Breathing slowly and into belly with TC - can only do about 1 sec in and 1 sec out - very little movement in ribcage Exercises: Rocking in quadruped with breathing Supine thoracic extension with UE overhead and inhale UE up Child pose with pillows and VC/TC to breathe into belly Manual/theract:  Abdominal fascial release and discussion of urge techniques  02/04/23: Neuromuscular re-education: Supine UE ball press 2 x 10 Exercises: Wide leg lower trunk rotation 2 x 10 Bil hip IR stretch 3 x 30 sec bil Supine piriformis stretch 2 min bil Supine hamstring stretch with strap 2 min bil Clam shell 2 x 10 bil Reverse clam shells 2 x 10 bil   12/31/22 : EVAL  Exercises: Seated hip abduction ball squeeze 10x Seated hip adduction band press 10x Lower trunk rotation  Therapeutic activities: Squatty potty/relaxed toilet mechanics Bowel massage  Check all possible CPT codes: [id number]- Therapeutic  Exercise and 97530 - Therapeutic Activities    Check all conditions that are expected to impact treatment: Cognitive impairment   If treatment provided at initial evaluation, no treatment charged due to lack of authorization.         PATIENT EDUCATION:  Education details: see above Person educated: Patient Education method: Explanation, Demonstration, Tactile cues, Verbal cues, and Handouts Education comprehension: verbalized understanding  HOME EXERCISE  PROGRAM: [id number]  ASSESSMENT:  CLINICAL IMPRESSION: Pt reports no constipation the past 4 days but has had pain.  Pt has very little movement in diaphragm when cued to inhale needing cues not to hold breath and to breathe into nose more slowly.  Pt was given exercises to increase spine and ribcage mobility to promote more optimum abdominal pressure. He may continue to benefit form skilled PT intervention in order to help improve urinary frequency, urgency, and incontinence in addition to constipation.   OBJECTIVE IMPAIRMENTS: decreased activity tolerance, decreased cognition, decreased coordination, decreased endurance, decreased mobility, decreased strength, increased fascial restrictions, increased muscle spasms, postural dysfunction, and pain.   ACTIVITY LIMITATIONS: continence  PARTICIPATION LIMITATIONS: community activity  PERSONAL FACTORS: Behavior pattern and Fitness are also affecting patient's functional outcome.   REHAB POTENTIAL: Fair communication difficulties  CLINICAL DECISION MAKING: Stable/uncomplicated  EVALUATION COMPLEXITY: Low   GOALS: Goals reviewed with patient? Yes  SHORT TERM GOALS: Target date: 02/04/23 - updated 02/04/23  Pt will be independent with HEP.   Baseline: Goal status: IN PROGRESS  2.  Pt will be independent with use of urge suppression technique in order to help decrease urinary urgency. Baseline: Pt goes to bathroom to urinate very frequently and has to go immediately after urge in order to prevent leak.  Goal status: IN PROGRESS  3.  Pt will be independent with diaphragmatic breathing and down training activities in order to improve pelvic floor relaxation.  Baseline:  Goal status:IN PROGRESS  4.  Pt will be independent with use of squatty potty, relaxed toileting mechanics, and improved bowel movement techniques in order to increase ease of bowel movements and complete evacuation.   Baseline: Pt has 2-3 bowel movements a week -  constipation may be impacting urinary dysfunction Goal status: IN PROGRESS    LONG TERM GOALS: Target date: 06/17/2023   Pt will be independent with advanced HEP.   Baseline:  Goal status: IN PROGRESS  2.  Patient will improve all lumbar A/ROM by 25%.  Baseline: al motions limited between 10-50% Goal status: IN PROGRESS  3.  Pt will demonstrate improved abdominal muscle mobility and rib cage excursion to allow more appropriate breathing/abdominal pressure management. Baseline: Significant rib cage and abdominal restriction Goal status: IN PROGRESS  4.  Pt will be able to go 2-3 hours in between voids without urgency or incontinence in order to improve QOL and perform all functional activities with less difficulty.   Baseline: Pt going to the bathroom very often (he's unsure how often) and will leak if he does not immediately go Goal status: IN PROGRESS     PLAN:  PT FREQUENCY: 1-2x/week  PT DURATION: 6 months  PLANNED INTERVENTIONS: Therapeutic exercises, Therapeutic activity, Neuromuscular re-education, Balance training, Gait training, Patient/Family education, Self Care, Joint mobilization, Dry Needling, Biofeedback, and Manual therapy  PLAN FOR NEXT SESSION: abdominal manual techniques; core mobility/strengthening; f/u on urge suppression technique, maybe start with nustep to work on cardio and diaphragm movement  Gustavus Bryant, PT, DPT 02/12/23 11:40 AM

## 2023-02-19 ENCOUNTER — Ambulatory Visit: Payer: Medicaid Other | Attending: Urology

## 2023-02-19 DIAGNOSIS — R3915 Urgency of urination: Secondary | ICD-10-CM | POA: Insufficient documentation

## 2023-02-19 DIAGNOSIS — R279 Unspecified lack of coordination: Secondary | ICD-10-CM

## 2023-02-19 DIAGNOSIS — M62838 Other muscle spasm: Secondary | ICD-10-CM

## 2023-02-19 DIAGNOSIS — M6281 Muscle weakness (generalized): Secondary | ICD-10-CM | POA: Diagnosis present

## 2023-02-19 DIAGNOSIS — R293 Abnormal posture: Secondary | ICD-10-CM

## 2023-02-19 NOTE — Therapy (Signed)
OUTPATIENT PHYSICAL THERAPY TREATMENT NOTE   Patient Name: Edwin Nicholson MRN: [id number] DOB:1981/08/03, 42 y.o., male Today's Date: 02/19/2023  PCP: Koleen Distance, MD REFERRING PROVIDER: Janith Lima, MD  END OF SESSION:   PT End of Session - 02/19/23 0806     Visit Number 4    Date for PT Re-Evaluation 06/17/23    Authorization Type CCME 3 visits expiring 02/24/23    Authorization - Visit Number 3    Authorization - Number of Visits 3    PT Start Time 0805    PT Stop Time 0843    PT Time Calculation (min) 38 min    Activity Tolerance Patient tolerated treatment well    Behavior During Therapy [facility] for tasks assessed/performed               History reviewed. No pertinent past medical history. History reviewed. No pertinent surgical history. Patient Active Problem List   Diagnosis Date Noted   Generalized abdominal pain 05/17/2021    REFERRING DIAG: N50.812 (ICD-10-CM) - Left testicular pain R39.15 (ICD-10-CM) - Urinary urgency  THERAPY DIAG:  Abnormal posture  Other muscle spasm  Muscle weakness (generalized)  Urinary urgency  Unspecified lack of coordination  Rationale for Evaluation and Treatment Rehabilitation  PERTINENT HISTORY: abdominal pain  PRECAUTIONS: NA  SUBJECTIVE:                                                                                                                                                                                      SUBJECTIVE STATEMENT:  Pt feels like bladder is getting better. Pt reports no lower abdominal pain this morning and it has been better over the last week. He states that he has not had bowel movement in 4 days, but is still using squatty potty.    PAIN:  Are you having pain? No   12/31/22 SUBJECTIVE STATEMENT: Pt states that he is having pain in lower abdomen and "the area where urine comes out". This has been for the last four days.  I notice more pain when blood wasn't low.  No pain  currently Fluid intake: some water - no sodas or caffeine   PAIN:  Are you having pain? No   PRECAUTIONS: None  WEIGHT BEARING RESTRICTIONS: No  FALLS:  Has patient fallen in last 6 months? No  LIVING ENVIRONMENT: Lives with: lives with their family Lives in: House/apartment  OCCUPATION: no  PLOF: Independent  PATIENT GOALS: to have less urgency and no leaking  PERTINENT HISTORY:  Abdominal pain   BOWEL MOVEMENT: Pain with bowel movement: Yes Type of bowel movement:Frequency every other day and Strain No Fully empty rectum:  Yes: - Leakage: No Pads: No Fiber supplement: No  URINATION: Pain with urination: No Fully empty bladder: Yes: - Stream: WNL Urgency: Yes: pt has to go immediately Frequency: unsure Leakage: Urge to void and Walking to the bathroom Pads: No  INTERCOURSE: Pain with intercourse: Not sexually active Climax: yes Ejaculation: No   OBJECTIVE:  12/31/22: DIAGNOSTIC FINDINGS:  [place] of scrotum WNL  COGNITION: Overall cognitive status: Within functional limits for tasks assessed     SENSATION: Light touch: Appears intact Proprioception: Appears intact  MUSCLE LENGTH: decreased hip flexor and abdominal muscle length   FUNCTIONAL TESTS: Lt weight shift   GAIT: Comments: forward folded posture, decreased bil hip extension  POSTURE: rounded shoulders, forward head, increased thoracic kyphosis, and posterior pelvic tilt  PELVIC ALIGNMENT: posterior pelvic tilt  LUMBARAROM/PROM:  A/PROM A/PROM  Eval (% available)  Flexion 50  Extension 10  Right lateral flexion 50  Left lateral flexion 50  Right rotation 25  Left rotation 25   (Blank rows = not tested)  LOWER EXTREMITY AROM/PROM:   LOWER EXTREMITY MMT: weakness present, but unable to perform formal testing with max verbal cues this session   PALPATION: GENERAL significant abdominal restriction; decreased rib cage mobility              External Perineal Exam NA               Internal Pelvic Floor NA Patient confirms identification and approves PT to assess internal pelvic floor and treatment No pt did not consent to external or internal pelvic floor muscle assessment today, but states that he may be willing to in the future  PELVIC MMT: NA   MMT eval  Internal Anal Sphincter   External Anal Sphincter   Puborectalis   Diastasis Recti   (Blank rows = not tested)  TONE: NA  TODAY'S TREATMENT:                                                                                                                              DATE:   02/19/23 Manual: Diaphragmatic release bil Lower abdominal release Neuromuscular re-education: Diaphragmatic breathing with bil UE fully flexed and relaxed to help improve thoracic mobility Exercises: Lower trunk rotation with bil UE fully flexed 2 x 60 sec bil Open books 10x bil Prone press ups 5x Clam shells 10x bil Reverse clam shells 10x bil Bridge 10x   02/12/23: Neuromuscular re-education: Breathing slowly and into belly with TC - can only do about 1 sec in and 1 sec out - very little movement in ribcage Exercises: Rocking in quadruped with breathing Supine thoracic extension with UE overhead and inhale UE up Child pose with pillows and VC/TC to breathe into belly Manual/theract:  Abdominal fascial release and discussion of urge techniques  02/04/23: Neuromuscular re-education: Supine UE ball press 2 x 10 Exercises: Wide leg lower trunk rotation 2 x 10 Bil hip IR stretch 3 x 30 sec bil  Supine piriformis stretch 2 min bil Supine hamstring stretch with strap 2 min bil Clam shell 2 x 10 bil Reverse clam shells 2 x 10 bil   PATIENT EDUCATION:  Education details: see above Person educated: Patient Education method: Explanation, Demonstration, Tactile cues, Verbal cues, and Handouts Education comprehension: verbalized understanding  HOME EXERCISE PROGRAM: [id number]  ASSESSMENT:  CLINICAL IMPRESSION: Pt  having more difficulty with constipation this week, but reports improvements in pain and urinary issues; he was not able to describe specific improvements in urinary dysfunction at this time, but states he feels like coming is helping. He has been very reliable in attending visits and working on initial HEP. He is still having significant restriction in diaphragm, upper abdominals, and thoracic/rib mobility; believe addressing this will allow better bladder/abdominal movement that will help improve urinary function and pain. He tolerated manual techniques very well and was able to perform exercises with report of appropriate stretch and challenge. He will continue to benefit form skilled PT intervention in order to help improve urinary frequency, urgency, and incontinence in addition to constipation.   OBJECTIVE IMPAIRMENTS: decreased activity tolerance, decreased cognition, decreased coordination, decreased endurance, decreased mobility, decreased strength, increased fascial restrictions, increased muscle spasms, postural dysfunction, and pain.   ACTIVITY LIMITATIONS: continence  PARTICIPATION LIMITATIONS: community activity  PERSONAL FACTORS: Behavior pattern and Fitness are also affecting patient's functional outcome.   REHAB POTENTIAL: Fair communication difficulties  CLINICAL DECISION MAKING: Stable/uncomplicated  EVALUATION COMPLEXITY: Low   GOALS: Goals reviewed with patient? Yes  SHORT TERM GOALS: Target date: 02/04/23 - updated 02/04/23  Pt will be independent with HEP.   Baseline: Goal status: IN PROGRESS  2.  Pt will be independent with use of urge suppression technique in order to help decrease urinary urgency. Baseline: Pt goes to bathroom to urinate very frequently and has to go immediately after urge in order to prevent leak.  Goal status: IN PROGRESS  3.  Pt will be independent with diaphragmatic breathing and down training activities in order to improve pelvic floor  relaxation.  Baseline:  Goal status:IN PROGRESS  4.  Pt will be independent with use of squatty potty, relaxed toileting mechanics, and improved bowel movement techniques in order to increase ease of bowel movements and complete evacuation.   Baseline: Pt has 2-3 bowel movements a week - constipation may be impacting urinary dysfunction Goal status: IN PROGRESS    LONG TERM GOALS: Target date: 06/17/2023   Pt will be independent with advanced HEP.   Baseline:  Goal status: IN PROGRESS  2.  Patient will improve all lumbar A/ROM by 25%.  Baseline: al motions limited between 10-50% Goal status: IN PROGRESS  3.  Pt will demonstrate improved abdominal muscle mobility and rib cage excursion to allow more appropriate breathing/abdominal pressure management. Baseline: Significant rib cage and abdominal restriction Goal status: IN PROGRESS  4.  Pt will be able to go 2-3 hours in between voids without urgency or incontinence in order to improve QOL and perform all functional activities with less difficulty.   Baseline: Pt going to the bathroom very often (he's unsure how often) and will leak if he does not immediately go Goal status: IN PROGRESS     PLAN:  PT FREQUENCY: 1-2x/week  PT DURATION: 6 months  PLANNED INTERVENTIONS: Therapeutic exercises, Therapeutic activity, Neuromuscular re-education, Balance training, Gait training, Patient/Family education, Self Care, Joint mobilization, Dry Needling, Biofeedback, and Manual therapy  PLAN FOR NEXT SESSION: abdominal manual techniques; core mobility/strengthening; f/u on urge  suppression technique, maybe start with nustep to work on cardio and diaphragm movement  Heather Roberts, PT, DPT04/01/2409:37 AM

## 2023-02-25 ENCOUNTER — Ambulatory Visit: Payer: Medicaid Other

## 2023-02-25 DIAGNOSIS — R3915 Urgency of urination: Secondary | ICD-10-CM

## 2023-02-25 DIAGNOSIS — M6281 Muscle weakness (generalized): Secondary | ICD-10-CM

## 2023-02-25 DIAGNOSIS — R293 Abnormal posture: Secondary | ICD-10-CM | POA: Diagnosis not present

## 2023-02-25 DIAGNOSIS — R279 Unspecified lack of coordination: Secondary | ICD-10-CM

## 2023-02-25 DIAGNOSIS — M62838 Other muscle spasm: Secondary | ICD-10-CM

## 2023-02-25 NOTE — Therapy (Signed)
OUTPATIENT PHYSICAL THERAPY TREATMENT NOTE   Patient Name: Edwin ChurchesMikah M Wuebker MRN: [id number] DOB:07/16/1981, 42 y.o., male Today's Date: 02/25/2023  PCP: Eleanora NeighborShahid, Shabana, MD REFERRING PROVIDER: Jannifer HickGay, Matthew R, MD  END OF SESSION:   PT End of Session - 02/25/23 1238     Visit Number 5    Date for PT Re-Evaluation 06/17/23    Authorization Type CCME    Authorization Time Period 02/25/2023-04/21/2023    Authorization - Visit Number 1    Authorization - Number of Visits 8    PT Start Time 1233    PT Stop Time 1311    PT Time Calculation (min) 38 min    Activity Tolerance Patient tolerated treatment well    [facility] assessed/performed                History reviewed. No pertinent past medical history. History reviewed. No pertinent surgical history. Patient Active Problem List   Diagnosis Date Noted   Generalized abdominal pain 05/17/2021    REFERRING DIAG: N50.812 (ICD-10-CM) - Left testicular pain R39.15 (ICD-10-CM) - Urinary urgency  THERAPY DIAG:  Abnormal posture  Other muscle spasm  Muscle weakness (generalized)  Urinary urgency  Unspecified lack of coordination  Rationale for Evaluation and Treatment Rehabilitation  PERTINENT HISTORY: abdominal pain  PRECAUTIONS: NA  SUBJECTIVE:                                                                                                                                                                                      SUBJECTIVE STATEMENT:  Pt reports 0/10 pain and he has not had any since last session. He reports no change in urinary urgency. He has not been using urge suppression technique.    PAIN:  Are you having pain? No   12/31/22 SUBJECTIVE STATEMENT: Pt states that he is having pain in lower abdomen and "the area where urine comes out". This has been for the last four days.  I notice more pain when blood wasn't low.  No pain currently Fluid intake: some water - no sodas or  caffeine   PAIN:  Are you having pain? No   PRECAUTIONS: None  WEIGHT BEARING RESTRICTIONS: No  FALLS:  Has patient fallen in last 6 months? No  LIVING ENVIRONMENT: Lives with: lives with their family Lives in: House/apartment  OCCUPATION: no  PLOF: Independent  PATIENT GOALS: to have less urgency and no leaking  PERTINENT HISTORY:  Abdominal pain   BOWEL MOVEMENT: Pain with bowel movement: Yes Type of bowel movement:Frequency every other day and Strain No Fully empty rectum: Yes: - Leakage: No Pads: No Fiber supplement: No  URINATION: Pain with urination: No Fully empty bladder: Yes: - Stream: WNL Urgency: Yes: pt has to go immediately Frequency: unsure Leakage: Urge to void and Walking to the bathroom Pads: No  INTERCOURSE: Pain with intercourse: Not sexually active Climax: yes Ejaculation: No   OBJECTIVE:  12/31/22: DIAGNOSTIC FINDINGS:  [place] of scrotum WNL  COGNITION: Overall cognitive status: Within functional limits for tasks assessed     SENSATION: Light touch: Appears intact Proprioception: Appears intact  MUSCLE LENGTH: decreased hip flexor and abdominal muscle length   FUNCTIONAL TESTS: Lt weight shift   GAIT: Comments: forward folded posture, decreased bil hip extension  POSTURE: rounded shoulders, forward head, increased thoracic kyphosis, and posterior pelvic tilt  PELVIC ALIGNMENT: posterior pelvic tilt  LUMBARAROM/PROM:  A/PROM A/PROM  Eval (% available)  Flexion 50  Extension 10  Right lateral flexion 50  Left lateral flexion 50  Right rotation 25  Left rotation 25   (Blank rows = not tested)  LOWER EXTREMITY AROM/PROM:   LOWER EXTREMITY MMT: weakness present, but unable to perform formal testing with max verbal cues this session   PALPATION: GENERAL significant abdominal restriction; decreased rib cage mobility              External Perineal Exam NA              Internal Pelvic Floor NA Patient confirms  identification and approves PT to assess internal pelvic floor and treatment No pt did not consent to external or internal pelvic floor muscle assessment today, but states that he may be willing to in the future  PELVIC MMT: NA   MMT eval  Internal Anal Sphincter   External Anal Sphincter   Puborectalis   Diastasis Recti   (Blank rows = not tested)  TONE: NA  TODAY'S TREATMENT:                                                                                                                              DATE:   02/25/23 Manual: Diaphragmatic release bil Lower abdominal release/bladder mobilization Neuromuscular re-education: Diaphragmatic breathing MET for improved lateral rib expansion Exercises: Supine lying with ball mid-thoracic level with wide arms for thoracic/rib mobility Lateral flexion in seated with peanut ball Therapeutic activities: Urge suppression technique - practice with contractions in supine - walked through practice episode of urgency  02/19/23 Manual: Diaphragmatic release bil Lower abdominal release Neuromuscular re-education: Diaphragmatic breathing with bil UE fully flexed and relaxed to help improve thoracic mobility Exercises: Lower trunk rotation with bil UE fully flexed 2 x 60 sec bil Open books 10x bil Prone press ups 5x Clam shells 10x bil Reverse clam shells 10x bil Bridge 10x   02/12/23: Neuromuscular re-education: Breathing slowly and into belly with TC - can only do about 1 sec in and 1 sec out - very little movement in ribcage Exercises: Rocking in quadruped with breathing Supine thoracic extension with UE overhead and inhale UE up Child  pose with pillows and VC/TC to breathe into belly Manual/theract:  Abdominal fascial release and discussion of urge techniques   PATIENT EDUCATION:  Education details: see above Person educated: Patient Education method: Explanation, Demonstration, Tactile cues, Verbal cues, and Handouts Education  comprehension: verbalized understanding  HOME EXERCISE PROGRAM: [id number]  ASSESSMENT:  CLINICAL IMPRESSION: Pt states that he has not had any pain in the last week. However, he still reports urinary urgency without any change. He has not been utilizing urge suppression technique; we reviewed and practiced this together, going over how to perform correct pelvic floor contractions. Good tolerance to manual techniques/bladder mobilization. Bladder palpated to be firm, but non-tender and no urgency. He did well with mobility activities reporting they felt weird, but a good stretch. He will continue to benefit form skilled PT intervention in order to help improve urinary frequency, urgency, and incontinence in addition to constipation.   OBJECTIVE IMPAIRMENTS: decreased activity tolerance, decreased cognition, decreased coordination, decreased endurance, decreased mobility, decreased strength, increased fascial restrictions, increased muscle spasms, postural dysfunction, and pain.   ACTIVITY LIMITATIONS: continence  PARTICIPATION LIMITATIONS: community activity  PERSONAL FACTORS: Behavior pattern and Fitness are also affecting patient's functional outcome.   REHAB POTENTIAL: Fair communication difficulties  CLINICAL DECISION MAKING: Stable/uncomplicated  EVALUATION COMPLEXITY: Low   GOALS: Goals reviewed with patient? Yes  SHORT TERM GOALS: Target date: 02/04/23 - updated 02/04/23  Pt will be independent with HEP.   Baseline: Goal status: IN PROGRESS  2.  Pt will be independent with use of urge suppression technique in order to help decrease urinary urgency. Baseline: Pt goes to bathroom to urinate very frequently and has to go immediately after urge in order to prevent leak.  Goal status: IN PROGRESS  3.  Pt will be independent with diaphragmatic breathing and down training activities in order to improve pelvic floor relaxation.  Baseline:  Goal status:IN PROGRESS  4.  Pt will  be independent with use of squatty potty, relaxed toileting mechanics, and improved bowel movement techniques in order to increase ease of bowel movements and complete evacuation.   Baseline: Pt has 2-3 bowel movements a week - constipation may be impacting urinary dysfunction Goal status: IN PROGRESS    LONG TERM GOALS: Target date: 06/17/2023   Pt will be independent with advanced HEP.   Baseline:  Goal status: IN PROGRESS  2.  Patient will improve all lumbar A/ROM by 25%.  Baseline: al motions limited between 10-50% Goal status: IN PROGRESS  3.  Pt will demonstrate improved abdominal muscle mobility and rib cage excursion to allow more appropriate breathing/abdominal pressure management. Baseline: Significant rib cage and abdominal restriction Goal status: IN PROGRESS  4.  Pt will be able to go 2-3 hours in between voids without urgency or incontinence in order to improve QOL and perform all functional activities with less difficulty.   Baseline: Pt going to the bathroom very often (he's unsure how often) and will leak if he does not immediately go Goal status: IN PROGRESS     PLAN:  PT FREQUENCY: 1-2x/week  PT DURATION: 6 months  PLANNED INTERVENTIONS: Therapeutic exercises, Therapeutic activity, Neuromuscular re-education, Balance training, Gait training, Patient/Family education, Self Care, Joint mobilization, Dry Needling, Biofeedback, and Manual therapy  PLAN FOR NEXT SESSION: abdominal manual techniques; core mobility/strengthening; f/u on urge suppression technique, maybe start with nustep to work on cardio and diaphragm movement  Julio Alm, PT, DPT04/09/241:09 PM

## 2023-02-25 NOTE — Patient Instructions (Signed)
Urge Incontinence  Ideal urination frequency is every 2-4 wakeful hours, which equates to 5-8 times within a 24-hour period.   Urge incontinence is leakage that occurs when the bladder muscle contracts, creating a sudden need to go before getting to the bathroom.   Going too often when your bladder isn't actually full can disrupt the body's automatic signals to store and hold urine longer, which will increase urgency/frequency.  In this case, the bladder "is running the show" and strategies can be learned to retrain this pattern.   One should be able to control the first urge to urinate, at around 150mL.  The bladder can hold up to a "grande latte," or 400mL. To help you gain control, practice the Urge Drill below when urgency strikes.  This drill will help retrain your bladder signals and allow you to store and hold urine longer.  The overall goal is to stretch out your time between voids to reach a more manageable voiding schedule.    Practice your "quick flicks" often throughout the day (each waking hour) even when you don't need feel the urge to go.  This will help strengthen your pelvic floor muscles, making them more effective in controlling leakage.  Urge Drill  When you feel an urge to go, follow these steps to regain control: Stop what you are doing and be still Take one deep breath, directing your air into your abdomen Think an affirming thought, such as "I've got this." Do 5 quick flicks of your pelvic floor Walk with control to the bathroom to void, or delay voiding  [facility] [address], [address] [place], [place] [address] Phone # [phone] Fax [phone]  

## 2023-03-04 ENCOUNTER — Ambulatory Visit: Payer: Medicaid Other

## 2023-03-11 ENCOUNTER — Ambulatory Visit: Payer: Medicaid Other

## 2023-03-18 ENCOUNTER — Ambulatory Visit: Payer: Medicaid Other

## 2023-03-18 DIAGNOSIS — R3915 Urgency of urination: Secondary | ICD-10-CM

## 2023-03-18 DIAGNOSIS — R279 Unspecified lack of coordination: Secondary | ICD-10-CM

## 2023-03-18 DIAGNOSIS — M62838 Other muscle spasm: Secondary | ICD-10-CM

## 2023-03-18 DIAGNOSIS — R293 Abnormal posture: Secondary | ICD-10-CM | POA: Diagnosis not present

## 2023-03-18 DIAGNOSIS — M6281 Muscle weakness (generalized): Secondary | ICD-10-CM

## 2023-03-18 NOTE — Therapy (Addendum)
OUTPATIENT PHYSICAL THERAPY TREATMENT NOTE   Patient Name: Edwin Nicholson MRN: [id number] DOB:09-Aug-1981, 42 y.o., male Today's Date: 03/18/2023  PCP: Eleanora Neighbor, MD REFERRING PROVIDER: Jannifer Hick, MD  END OF SESSION:   PT End of Session - 03/18/23 1237     Visit Number 6    Date for PT Re-Evaluation 06/17/23    Authorization Type [employer]    Authorization Time Period 02/25/2023-04/21/2023    Authorization - Visit Number 2    Authorization - Number of Visits 8    PT Start Time 1230    PT Stop Time 1310    PT Time Calculation (min) 40 min    Activity Tolerance Patient tolerated treatment well    Behavior During Therapy [facility] for tasks assessed/performed                 History reviewed. No pertinent past medical history. History reviewed. No pertinent surgical history. Patient Active Problem List   Diagnosis Date Noted   Generalized abdominal pain 05/17/2021    REFERRING DIAG: N50.812 (ICD-10-CM) - Left testicular pain R39.15 (ICD-10-CM) - Urinary urgency  THERAPY DIAG:  Abnormal posture  Other muscle spasm  Muscle weakness (generalized)  Urinary urgency  Unspecified lack of coordination  Rationale for Evaluation and Treatment Rehabilitation  PERTINENT HISTORY: abdominal pain  PRECAUTIONS: NA  SUBJECTIVE:                                                                                                                                                                                      SUBJECTIVE STATEMENT:  Pt states that he still has trouble with bowel movements, and he typically has very small bowel movements. He had increase in pelvic pain last week, but is not having any this week. He also reports that bowel movements are much better, and it's just his peeing that he struggles with. He states that the urge drill only works every 4th time. He also reports he does his exercises every 4 times.    PAIN:  Are you having pain? No   12/31/22 SUBJECTIVE  STATEMENT: Pt states that he is having pain in lower abdomen and "the area where urine comes out". This has been for the last four days.  I notice more pain when blood wasn't low.  No pain currently Fluid intake: some water - no sodas or caffeine   PAIN:  Are you having pain? No   PRECAUTIONS: None  WEIGHT BEARING RESTRICTIONS: No  FALLS:  Has patient fallen in last 6 months? No  LIVING ENVIRONMENT: Lives with: lives with their family Lives in: House/apartment  OCCUPATION: no  PLOF: Independent  PATIENT GOALS:  to have less urgency and no leaking  PERTINENT HISTORY:  Abdominal pain   BOWEL MOVEMENT: Pain with bowel movement: Yes Type of bowel movement:Frequency every other day and Strain No Fully empty rectum: Yes: - Leakage: No Pads: No Fiber supplement: No  URINATION: Pain with urination: No Fully empty bladder: Yes: - Stream: WNL Urgency: Yes: pt has to go immediately Frequency: unsure Leakage: Urge to void and Walking to the bathroom Pads: No  INTERCOURSE: Pain with intercourse: Not sexually active Climax: yes Ejaculation: No   OBJECTIVE:  12/31/22: DIAGNOSTIC FINDINGS:  [place] of scrotum WNL  COGNITION: Overall cognitive status: Within functional limits for tasks assessed     SENSATION: Light touch: Appears intact Proprioception: Appears intact  MUSCLE LENGTH: decreased hip flexor and abdominal muscle length   FUNCTIONAL TESTS: Lt weight shift   GAIT: Comments: forward folded posture, decreased bil hip extension  POSTURE: rounded shoulders, forward head, increased thoracic kyphosis, and posterior pelvic tilt  PELVIC ALIGNMENT: posterior pelvic tilt  LUMBARAROM/PROM:  A/PROM A/PROM  Eval (% available)  Flexion 50  Extension 10  Right lateral flexion 50  Left lateral flexion 50  Right rotation 25  Left rotation 25   (Blank rows = not tested)  LOWER EXTREMITY AROM/PROM:   LOWER EXTREMITY MMT: weakness present, but unable to  perform formal testing with max verbal cues this session   PALPATION: GENERAL significant abdominal restriction; decreased rib cage mobility              External Perineal Exam NA              Internal Pelvic Floor NA Patient confirms identification and approves PT to assess internal pelvic floor and treatment No pt did not consent to external or internal pelvic floor muscle assessment today, but states that he may be willing to in the future  PELVIC MMT: NA   MMT eval  Internal Anal Sphincter   External Anal Sphincter   Puborectalis   Diastasis Recti   (Blank rows = not tested)  TONE: NA  TODAY'S TREATMENT:                                                                                                                              DATE:  03/18/23 Manual: Manual stretching: Bil lats Bil piriformis Bil internal rotation Neuromuscular re-education: Diaphragmatic breathing with multimodal cues for slower pace and improved rib cage/lateral wall expansion Exercises: Butterfly stretch 2 x 2 minutes Lower trunk rotation with tactile cues Alternating bil UE flexion with tactile cues Supine hip adduction isometric 2 x 10 Supine hip abduction clam 2 x 10 blue band Bridge 2 x 10 Therapeutic activities: Urge drill review HEP review  02/25/23 Manual: Diaphragmatic release bil Lower abdominal release/bladder mobilization Neuromuscular re-education: Diaphragmatic breathing MET for improved lateral rib expansion Exercises: Supine lying with ball mid-thoracic level with wide arms for thoracic/rib mobility Lateral flexion in seated with peanut ball Therapeutic activities: Urge suppression  technique - practice with contractions in supine - walked through practice episode of urgency  02/19/23 Manual: Diaphragmatic release bil Lower abdominal release Neuromuscular re-education: Diaphragmatic breathing with bil UE fully flexed and relaxed to help improve thoracic  mobility Exercises: Lower trunk rotation with bil UE fully flexed 2 x 60 sec bil Open books 10x bil Prone press ups 5x Clam shells 10x bil Reverse clam shells 10x bil Bridge 10x  PATIENT EDUCATION:  Education details: see above Person educated: Patient Education method: Programmer, multimedia, Demonstration, Actor cues, Verbal cues, and Handouts Education comprehension: verbalized understanding  HOME EXERCISE PROGRAM: [id number]  ASSESSMENT:  CLINICAL IMPRESSION: Pt reports that he had increase in pain last week. Since he initially reported that bowel movements are still problematic, wondering if there is a relationship between bowel movements and pelvic pain/abnormal urination/urgency. However, when questioned for details, he states that bowel movements are in fact normal. We will continue to work on good bowel movement techniques to make sure that this is not a part of urinary urgency and pelvic pain that he is still dealing with. Urge drill reviewed in detailed several times this session for better carry-over. He tolerated stretches and strengthening well with no complaints. He will continue to benefit form skilled PT intervention in order to help improve urinary frequency, urgency, and incontinence in addition to constipation.   OBJECTIVE IMPAIRMENTS: decreased activity tolerance, decreased cognition, decreased coordination, decreased endurance, decreased mobility, decreased strength, increased fascial restrictions, increased muscle spasms, postural dysfunction, and pain.   ACTIVITY LIMITATIONS: continence  PARTICIPATION LIMITATIONS: community activity  PERSONAL FACTORS: Behavior pattern and Fitness are also affecting patient's functional outcome.   REHAB POTENTIAL: Fair communication difficulties  CLINICAL DECISION MAKING: Stable/uncomplicated  EVALUATION COMPLEXITY: Low   GOALS: Goals reviewed with patient? Yes  SHORT TERM GOALS: Target date: 02/04/23 - updated 02/04/23 - updated  03/18/23  Pt will be independent with HEP.   Baseline: Goal status: MET 03/18/23  2.  Pt will be independent with use of urge suppression technique in order to help decrease urinary urgency. Baseline:  Goal status: Met 03/18/23  3.  Pt will be independent with diaphragmatic breathing and down training activities in order to improve pelvic floor relaxation.  Baseline:  Goal status:IN PROGRESS  4.  Pt will be independent with use of squatty potty, relaxed toileting mechanics, and improved bowel movement techniques in order to increase ease of bowel movements and complete evacuation.   Baseline: Pt has 2-3 bowel movements a week - constipation may be impacting urinary dysfunction Goal status: IN PROGRESS    LONG TERM GOALS: Target date: 06/17/2023 - updated 03/18/23  Pt will be independent with advanced HEP.   Baseline:  Goal status: IN PROGRESS  2.  Patient will improve all lumbar A/ROM by 25%.  Baseline: al motions limited between 10-50% Goal status: IN PROGRESS  3.  Pt will demonstrate improved abdominal muscle mobility and rib cage excursion to allow more appropriate breathing/abdominal pressure management. Baseline: Significant rib cage and abdominal restriction Goal status: IN PROGRESS  4.  Pt will be able to go 2-3 hours in between voids without urgency or incontinence in order to improve QOL and perform all functional activities with less difficulty.   Baseline: Pt going to the bathroom very often (he's unsure how often) and will leak if he does not immediately go Goal status: IN PROGRESS     PLAN:  PT FREQUENCY: 1-2x/week  PT DURATION: 6 months  PLANNED INTERVENTIONS: Therapeutic exercises, Therapeutic activity, Neuromuscular re-education,  Balance training, Gait training, Patient/Family education, Self Care, Joint mobilization, Dry Needling, Biofeedback, and Manual therapy  PLAN FOR NEXT SESSION: Continue to progress strengthening/mobility; review HEP for  carry-over  Julio Alm, PT, DPT04/30/241:24 PM  PHYSICAL THERAPY DISCHARGE SUMMARY  Visits from Start of Care: 6  Current functional level related to goals / functional outcomes: Independent   Remaining deficits: See above   Education / Equipment: HEP   Patient agrees to discharge. Patient goals were partially met. Patient is being discharged due to not returning since the last visit.  Julio Alm, PT, DPT02/03/254:35 PM

## 2024-05-07 IMAGING — US US ABDOMEN LIMITED
1 series · 14 of 25 positions shown · non-contrast
Comparison: None Available.

CLINICAL DATA: Right upper quadrant abdominal pain.

EXAM:
ULTRASOUND ABDOMEN LIMITED RIGHT UPPER QUADRANT

[Series 1: us abdomen limited ruq (liver/gb) · 14 of 57 slices shown]
[im 1/57]
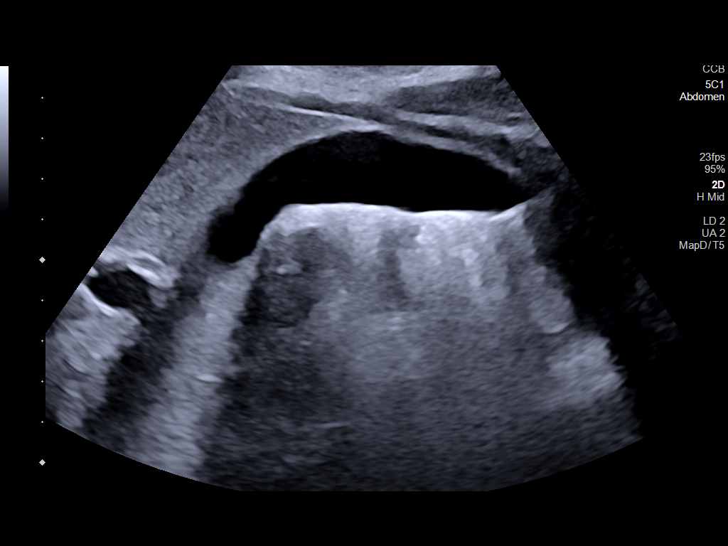
[im 5/57]
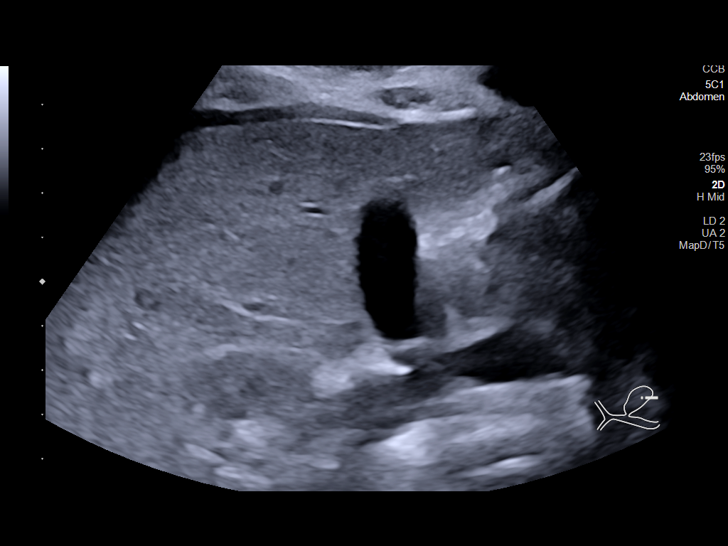
[im 10/57]
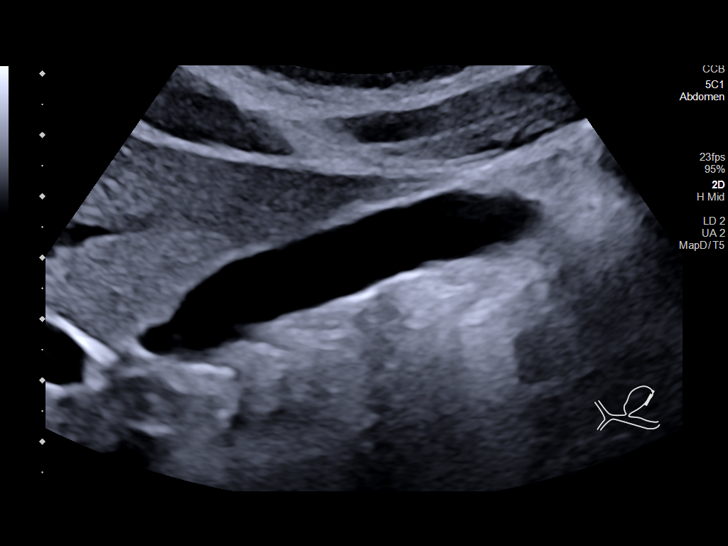
[im 15/57]
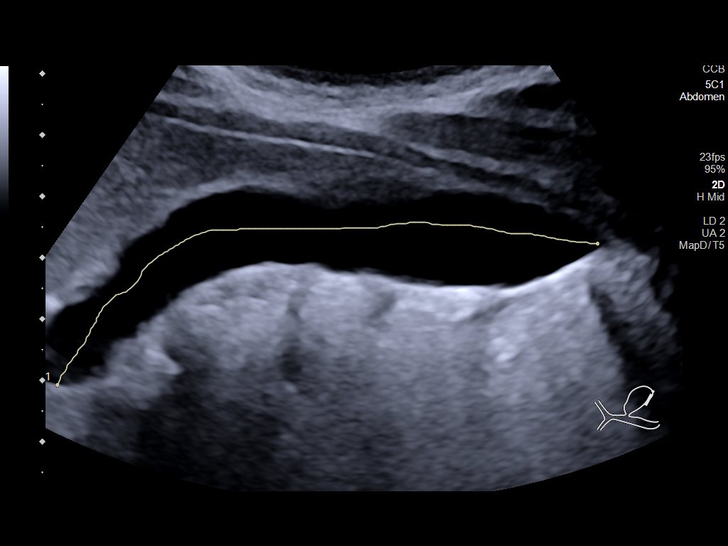
[im 19/57]
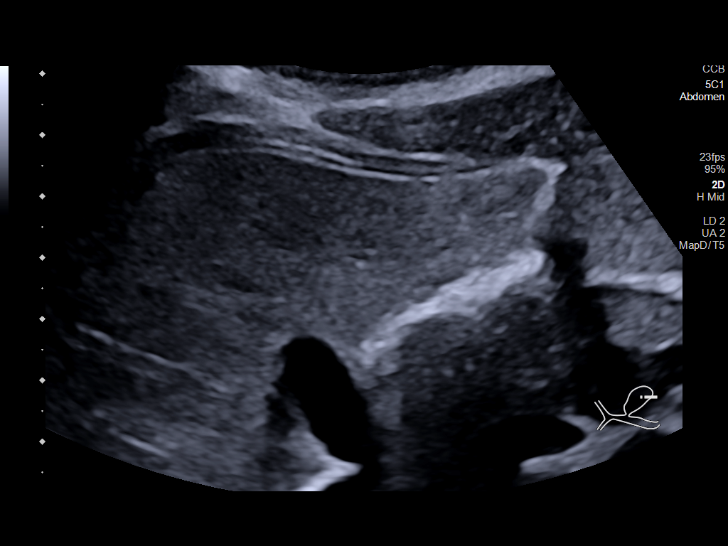
[im 22/57]
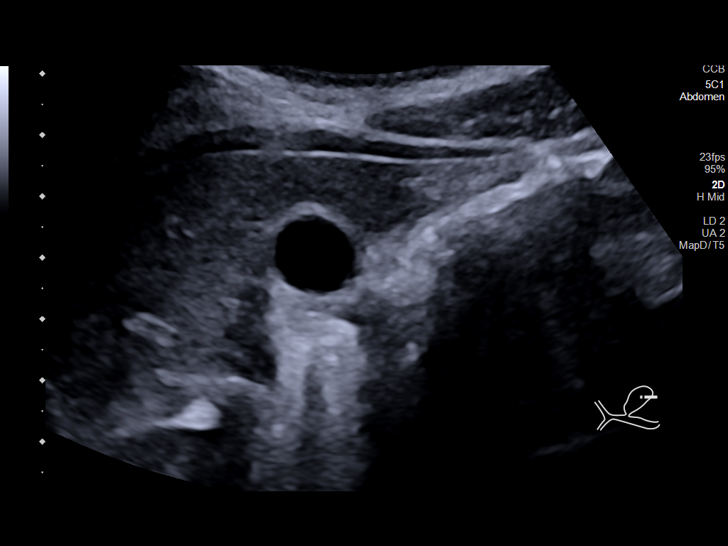
[im 26/57]
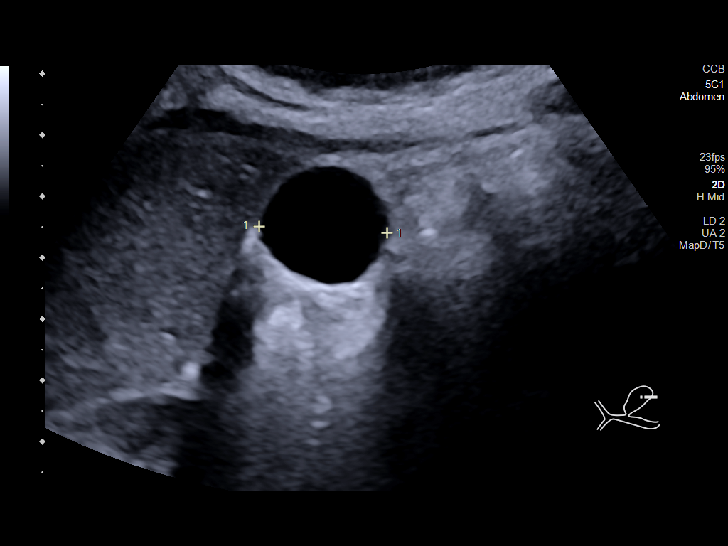
[im 31/57]
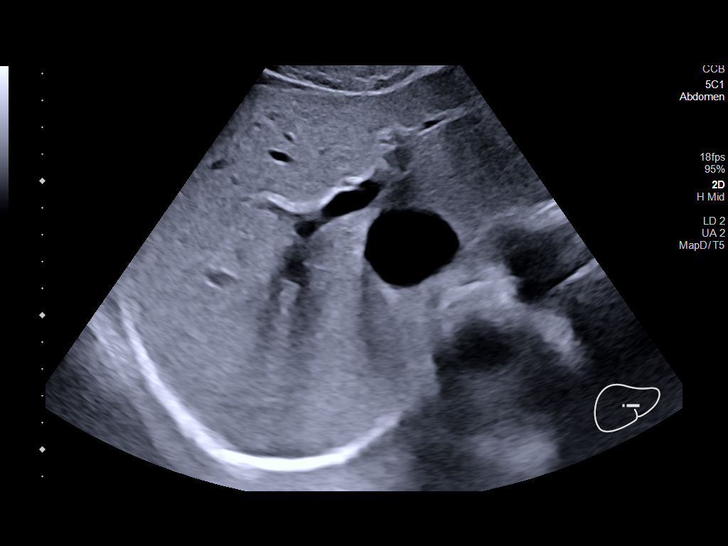
[im 36/57]
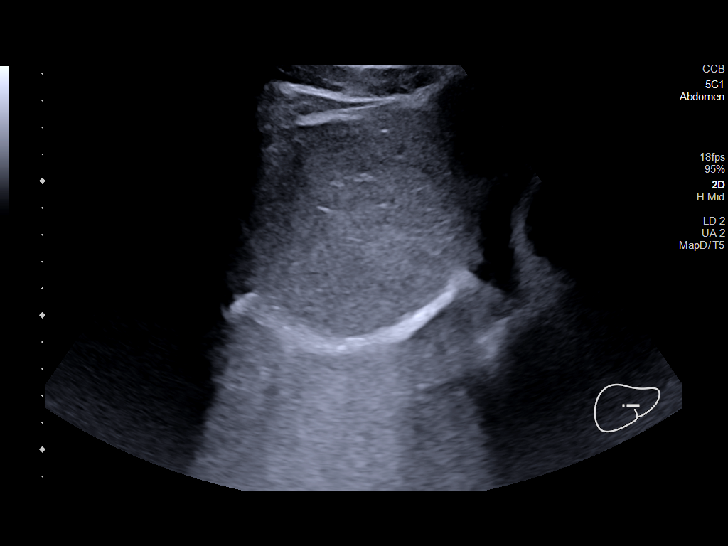
[im 38/57]
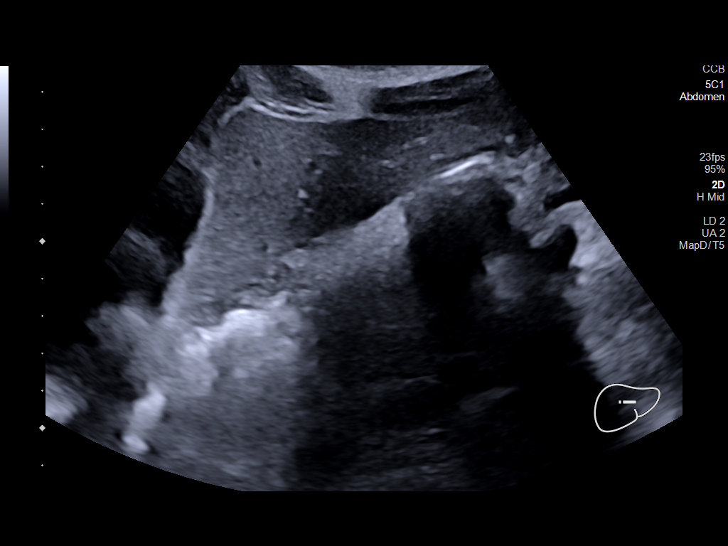
[im 43/57]
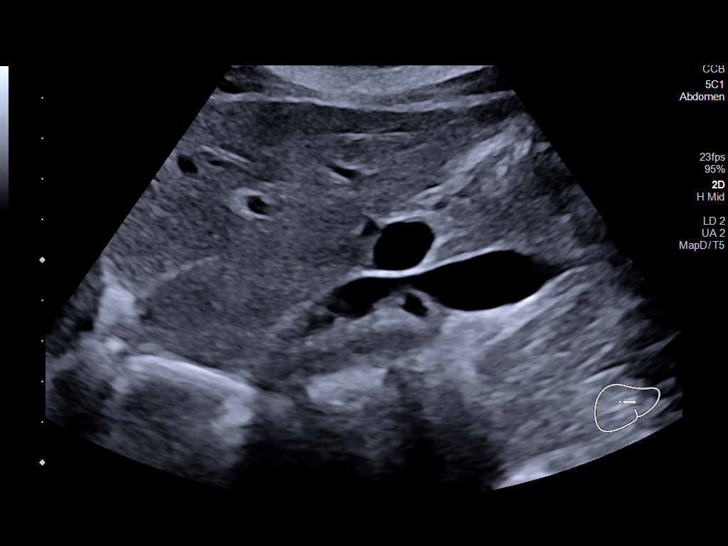
[im 47/57]
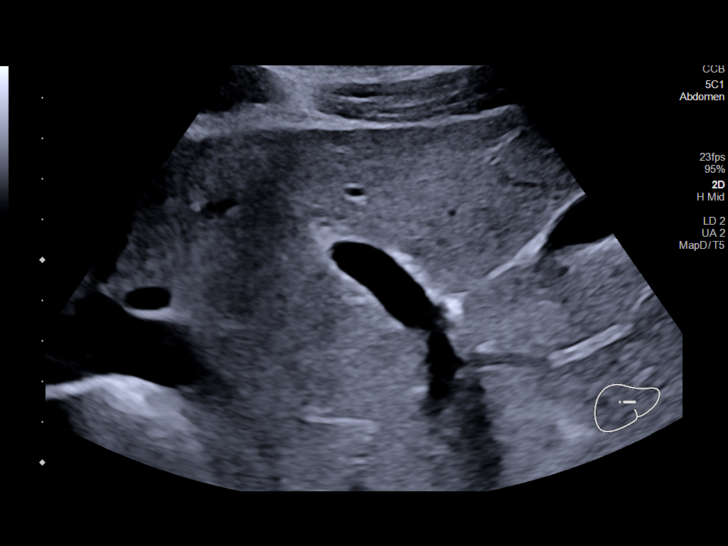
[im 52/57]
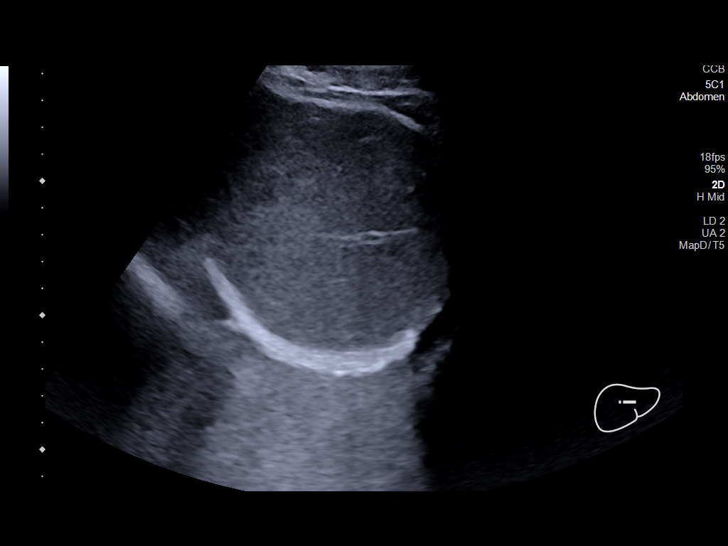
[im 57/57]
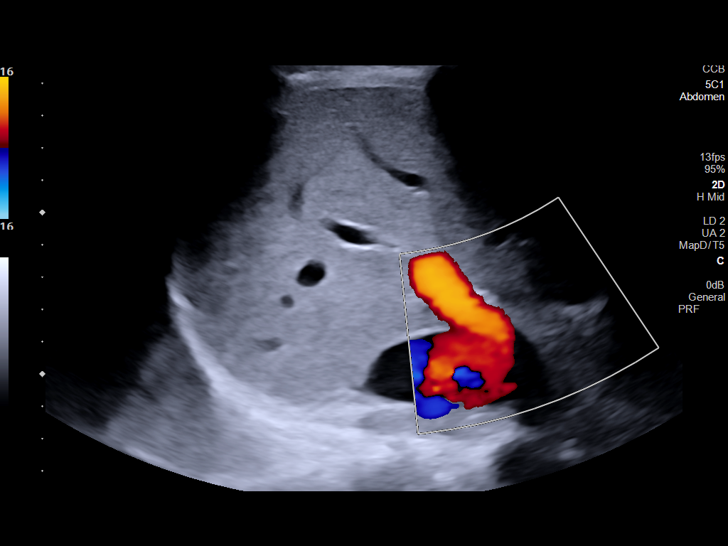

[14 of 25 positions shown; findings below may reference images not displayed]

FINDINGS: Gallbladder:

No gallstones or wall thickening visualized. No sonographic Murphy
sign noted by sonographer.

Common bile duct:

Diameter: 2 mm

Liver:

No focal lesion identified. Within normal limits in parenchymal
echogenicity. Portal vein is patent on color Doppler imaging with
normal direction of blood flow towards the liver.

Other: None.
IMPRESSION: Unremarkable right upper quadrant ultrasound.
